# Patient Record
Sex: Female | Born: 1985 | Race: White | Hispanic: No | Marital: Married | Smoking: Former smoker
Health system: Southern US, Community
[De-identification: ages and names within clinical notes are randomized; demographics above are authoritative.]

## PROBLEM LIST (undated history)

## (undated) DIAGNOSIS — F419 Anxiety disorder, unspecified: Secondary | ICD-10-CM

## (undated) DIAGNOSIS — R569 Unspecified convulsions: Secondary | ICD-10-CM

## (undated) DIAGNOSIS — S161XXA Strain of muscle, fascia and tendon at neck level, initial encounter: Secondary | ICD-10-CM

## (undated) DIAGNOSIS — F32A Depression, unspecified: Secondary | ICD-10-CM

## (undated) DIAGNOSIS — G709 Myoneural disorder, unspecified: Secondary | ICD-10-CM

## (undated) DIAGNOSIS — M62838 Other muscle spasm: Secondary | ICD-10-CM

## (undated) DIAGNOSIS — F329 Major depressive disorder, single episode, unspecified: Secondary | ICD-10-CM

## (undated) HISTORY — DX: Myoneural disorder, unspecified: G70.9

## (undated) HISTORY — DX: Anxiety disorder, unspecified: F41.9

## (undated) HISTORY — PX: WISDOM TOOTH EXTRACTION: SHX21

## (undated) HISTORY — DX: Major depressive disorder, single episode, unspecified: F32.9

## (undated) HISTORY — DX: Depression, unspecified: F32.A

---
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
## 2003-09-19 ENCOUNTER — Other Ambulatory Visit: Admission: RE | Admit: 2003-09-19 | Discharge: 2003-09-19 | Payer: Self-pay | Admitting: Obstetrics and Gynecology

## 2004-10-02 ENCOUNTER — Other Ambulatory Visit: Admission: RE | Admit: 2004-10-02 | Discharge: 2004-10-02 | Payer: Self-pay | Admitting: Obstetrics and Gynecology

## 2005-09-11 ENCOUNTER — Other Ambulatory Visit: Admission: RE | Admit: 2005-09-11 | Discharge: 2005-09-11 | Payer: Self-pay | Admitting: Obstetrics and Gynecology

## 2011-02-01 ENCOUNTER — Other Ambulatory Visit: Payer: Self-pay | Admitting: Internal Medicine

## 2011-02-01 DIAGNOSIS — M542 Cervicalgia: Secondary | ICD-10-CM

## 2011-02-05 ENCOUNTER — Ambulatory Visit
Admission: RE | Admit: 2011-02-05 | Discharge: 2011-02-05 | Disposition: A | Discharge status: Home / Self Care | Payer: BC Managed Care – PPO | Source: Ambulatory Visit | Attending: Internal Medicine | Admitting: Internal Medicine

## 2011-02-05 DIAGNOSIS — M542 Cervicalgia: Secondary | ICD-10-CM

## 2012-08-21 ENCOUNTER — Ambulatory Visit (INDEPENDENT_AMBULATORY_CARE_PROVIDER_SITE_OTHER): Payer: BC Managed Care – PPO | Admitting: Internal Medicine

## 2012-08-21 VITALS — BP 118/80 | HR 69 | Temp 98.2°F | Resp 16 | Ht 64.5 in | Wt 142.0 lb

## 2012-08-21 DIAGNOSIS — J019 Acute sinusitis, unspecified: Secondary | ICD-10-CM

## 2012-08-21 DIAGNOSIS — F411 Generalized anxiety disorder: Secondary | ICD-10-CM | POA: Insufficient documentation

## 2012-08-21 DIAGNOSIS — F341 Dysthymic disorder: Secondary | ICD-10-CM

## 2012-08-21 DIAGNOSIS — F418 Other specified anxiety disorders: Secondary | ICD-10-CM | POA: Insufficient documentation

## 2012-08-21 DIAGNOSIS — G4700 Insomnia, unspecified: Secondary | ICD-10-CM

## 2012-08-21 MED ORDER — AZITHROMYCIN 250 MG PO TABS: ORAL_TABLET | ORAL | Status: DC

## 2012-08-21 MED ORDER — FLUOXETINE HCL 20 MG PO CAPS: 60.0000 mg | ORAL_CAPSULE | Freq: Every day | ORAL | Status: DC

## 2012-08-21 MED ORDER — CLONAZEPAM 1 MG PO TABS: 1.0000 mg | ORAL_TABLET | Freq: Two times a day (BID) | ORAL | Status: DC | PRN

## 2012-08-21 MED ORDER — ZOLPIDEM TARTRATE 5 MG PO TABS: 5.0000 mg | ORAL_TABLET | Freq: Every evening | ORAL | Status: DC | PRN

## 2012-08-21 NOTE — Progress Notes (Signed)
Subjective:    Patient ID: Stefanie Holder, female    DOB: [birth year], 27 y.o.   MRN: [id number]  HPI  Patient comes in today with complaints of fatigue, sore throat, coughing for the past week. Coughing is productive but now she feels her voice is starting go. She has tried Ibuprofen and NyQuil. She is not sure if she has had a fever. She denies any difficulty breathing. She has woken up a few times at night coughing. Long history of sinus problems and allergies.  Patient suffers from depression and needs a refill of her Prozac. She is satisfied with the results of the medication. She is unable to go to her regular doctor, Dr. Waverly Ferrari because she retired. The office will not just transfer her records, she has to establish as a new patient again. She would like to have [place] manage her medication for her. She has seen me multiple times, as does her mother.   Patient just went to a holistic doctor in [place] one year ago for joint and neck pain. He found that she does not absorb B vitamins. She was put on Methyfolate. The doctor suggested that she might be able to ween off the anxiety and depression medications. Patient has a strong family history of anxiety and depression, she does not think this is a good idea.   She takes Klonpin each night. She is still a Product/process development scientist. She worries about everything. She is worried about an upcoming trip, her neck spasming again. She is not under as much stress as she was. Denies obsessions.  Social history-nice established relationship Donicia Druck to [place] with family soon  Child: Well  Review of Systems  No weight loss Insomnia controlled by Klonopin No chest pain or palpitations Continues with lots of trouble with neck and shoulder pain/at one point required a lot of work but is now much better however is not doing daily exercises as requested Much of this is postural Her no chest pain or palpitations No neurological problems    Objective:   Physical  Exam BP 118/80  Pulse 69  Temp(Src) 98.2 F (36.8 C) (Oral)  Resp 16  Ht 5' 4.5" (1.638 m)  Wt 142 lb (64.411 kg)  BMI 24.01 kg/m2  SpO2 98%  LMP 08/05/2012 PERRLA/ EOM s conj TMs clear Nares boggy with purulent Throat clear without lymphadenopathy Chest clear to auscultation Mildly tender trapezii kyphotic posture Neck has good range of motion No neurological changes and extremities      Assessment & Plan:  Generalized anxiety disorder  Depression with anxiety Sinusitis, acute Insomnia  Recurrent neck pain  Yoga/exercises Medicines refilled Long discussion about deciding what role anxiety plays whether we need to work harder to control this  Meds ordered this encounter  Medications  . DISCONTD: FLUoxetine (PROZAC) 20 MG capsule    Sig: Take 20 mg by mouth daily. Takes 60mg  ( three 20mg  daily)  . DISCONTD: clonazePAM (KLONOPIN) 1 MG tablet    Sig: Take 1 mg by mouth 2 (two) times daily as needed for anxiety.  Marland Kitchen DISCONTD: zolpidem (AMBIEN) 5 MG tablet    Sig: Take 5 mg by mouth at bedtime as needed for sleep.  . Norgestimate-Ethinyl Estradiol Triphasic (ORTHO TRI-CYCLEN, 28,) 0.18/0.215/0.25 MG-35 MCG tablet    Sig: Take 1 tablet by mouth daily.  . Multiple Vitamins-Minerals (MULTIVITAMIN WITH MINERALS) tablet    Sig: Take 1 tablet by mouth daily.  Marland Kitchen azithromycin (ZITHROMAX) 250 MG tablet    Sig: Take  2 on day one. Then 1 each day for 4 days.    Dispense:  6 each    Refill:  0  . FLUoxetine (PROZAC) 20 MG capsule    Sig: Take 3 capsules (60 mg total) by mouth daily.    Dispense:  270 capsule    Refill:  3  . clonazePAM (KLONOPIN) 1 MG tablet    Sig: Take 1 tablet (1 mg total) by mouth 2 (two) times daily as needed for anxiety.    Dispense:  30 tablet    Refill:  3  . zolpidem (AMBIEN) 5 MG tablet    Sig: Take 1 tablet (5 mg total) by mouth at bedtime as needed for sleep.    Dispense:  30 tablet    Refill:  5

## 2012-12-20 ENCOUNTER — Ambulatory Visit (INDEPENDENT_AMBULATORY_CARE_PROVIDER_SITE_OTHER): Payer: BC Managed Care – PPO | Admitting: Family Medicine

## 2012-12-20 VITALS — BP 110/62 | HR 70 | Temp 98.6°F | Resp 16 | Ht 64.5 in | Wt 135.8 lb

## 2012-12-20 DIAGNOSIS — J019 Acute sinusitis, unspecified: Secondary | ICD-10-CM

## 2012-12-20 MED ORDER — METHYLPREDNISOLONE ACETATE 80 MG/ML IJ SUSP
80.0000 mg | Freq: Once | INTRAMUSCULAR | Status: AC
  Administered 2012-12-20: 80 mg via INTRAMUSCULAR

## 2012-12-20 MED ORDER — FLUTICASONE PROPIONATE 50 MCG/ACT NA SUSP: 2.0000 | Freq: Every day | NASAL | Status: DC

## 2012-12-20 MED ORDER — IPRATROPIUM BROMIDE 0.03 % NA SOLN: 2.0000 | Freq: Four times a day (QID) | NASAL | Status: DC

## 2012-12-20 MED ORDER — AZITHROMYCIN 250 MG PO TABS: ORAL_TABLET | ORAL | Status: DC

## 2012-12-20 MED ORDER — HYDROCODONE-HOMATROPINE 5-1.5 MG/5ML PO SYRP: 5.0000 mL | ORAL_SOLUTION | Freq: Three times a day (TID) | ORAL | Status: DC | PRN

## 2012-12-20 NOTE — Progress Notes (Signed)
Subjective:    Patient ID: Stefanie Holder, female    DOB: 1985-09-28, 27 y.o.   MRN: [id number] Chief Complaint  Patient presents with  . Cough    x 1 wk productive/green  . Sinusitis    x 1 wk   HPI  9d ago woke in the middle of the night with pharyngitis which resolved mostly but very fatigued, feeling ill, lots of sinus pain, HAs, low grade fevers, cough productive of green sputum.  Has been taking nyquil and dayquil and otc cold medicine and ibuprofen.  Not sleeping well due to sxs.  + adenopathy, no ear or jaw pain.  Has been nauseated but tol po. Nml BM and voids.    Past Medical History  Diagnosis Date  . Depression   . Anxiety   . Neuromuscular disorder     MTHFR gene controlled on methyl folate supp and vit B6 and 12   Current Outpatient Prescriptions on File Prior to Visit  Medication Sig Dispense Refill  . clonazePAM (KLONOPIN) 1 MG tablet Take 1 tablet (1 mg total) by mouth 2 (two) times daily as needed for anxiety.  30 tablet  3  . FLUoxetine (PROZAC) 20 MG capsule Take 3 capsules (60 mg total) by mouth daily.  270 capsule  3  . Multiple Vitamins-Minerals (MULTIVITAMIN WITH MINERALS) tablet Take 1 tablet by mouth daily.      . Norgestimate-Ethinyl Estradiol Triphasic (ORTHO TRI-CYCLEN, 28,) 0.18/0.215/0.25 MG-35 MCG tablet Take 1 tablet by mouth daily.      Marland Kitchen zolpidem (AMBIEN) 5 MG tablet Take 1 tablet (5 mg total) by mouth at bedtime as needed for sleep.  30 tablet  5   No current facility-administered medications on file prior to visit.   Allergies  Allergen Reactions  . Amoxapine And Related   . Penicillins      Review of Systems  Constitutional: Positive for fever, activity change and fatigue. Negative for chills, diaphoresis and appetite change.  HENT: Positive for congestion, sore throat, rhinorrhea, sneezing, postnasal drip and sinus pressure. Negative for ear pain, nosebleeds, trouble swallowing, neck pain, neck stiffness, dental problem, voice change and  ear discharge.   Eyes: Negative for discharge and itching.  Respiratory: Positive for cough. Negative for shortness of breath.   Cardiovascular: Negative for chest pain.  Gastrointestinal: Positive for nausea. Negative for vomiting and abdominal pain.  Skin: Negative for rash.  Neurological: Positive for headaches. Negative for dizziness and syncope.  Hematological: Positive for adenopathy.  Psychiatric/Behavioral: Positive for sleep disturbance.      BP 110/62  Pulse 70  Temp(Src) 98.6 F (37 C) (Oral)  Resp 16  Ht 5' 4.5" (1.638 m)  Wt 135 lb 12.8 oz (61.598 kg)  BMI 22.96 kg/m2  SpO2 100%  LMP 11/23/2012 Objective:   Physical Exam  Constitutional: She is oriented to person, place, and time. She appears well-developed and well-nourished. She appears lethargic. She appears ill. No distress.  HENT:  Head: Normocephalic and atraumatic.  Right Ear: External ear and ear canal normal. Tympanic membrane is retracted. A middle ear effusion is present.  Left Ear: External ear and ear canal normal. Tympanic membrane is retracted. A middle ear effusion is present.  Nose: Mucosal edema and rhinorrhea present. Right sinus exhibits maxillary sinus tenderness. Left sinus exhibits maxillary sinus tenderness.  Mouth/Throat: Uvula is midline and mucous membranes are normal. Posterior oropharyngeal erythema present. No oropharyngeal exudate, posterior oropharyngeal edema or tonsillar abscesses.  Eyes: Conjunctivae are normal. Right eye exhibits no  discharge. Left eye exhibits no discharge. No scleral icterus.  Neck: Normal range of motion. Neck supple.  Cardiovascular: Normal rate, regular rhythm, normal heart sounds and intact distal pulses.   Pulmonary/Chest: Effort normal and breath sounds normal.  Lymphadenopathy:       Head (right side): Submandibular adenopathy present. No preauricular and no posterior auricular adenopathy present.       Head (left side): Submandibular adenopathy present. No  preauricular and no posterior auricular adenopathy present.    She has no cervical adenopathy.       Right: No supraclavicular adenopathy present.       Left: No supraclavicular adenopathy present.  Neurological: She is oriented to person, place, and time. She appears lethargic.  Skin: Skin is warm and dry. She is not diaphoretic. No erythema.  Psychiatric: She has a normal mood and affect. Her behavior is normal.      Assessment & Plan:  Sinusitis, acute - Plan: azithromycin (ZITHROMAX) 250 MG tablet, methylPREDNISolone acetate (DEPO-MEDROL) injection 80 mg  Meds ordered this encounter  Medications  . azithromycin (ZITHROMAX) 250 MG tablet    Sig: Take 2 on day one. Then 1 each day for 4 days.    Dispense:  6 each    Refill:  0  . HYDROcodone-homatropine (HYCODAN) 5-1.5 MG/5ML syrup    Sig: Take 5 mLs by mouth every 8 (eight) hours as needed for cough.    Dispense:  120 mL    Refill:  0  . ipratropium (ATROVENT) 0.03 % nasal spray    Sig: Place 2 sprays into the nose 4 (four) times daily.    Dispense:  30 mL    Refill:  1  . fluticasone (FLONASE) 50 MCG/ACT nasal spray    Sig: Place 2 sprays into the nose daily.    Dispense:  16 g    Refill:  1  . methylPREDNISolone acetate (DEPO-MEDROL) injection 80 mg    Sig:

## 2012-12-20 NOTE — Patient Instructions (Signed)
Hot showers or breathing in steam may help loosen the congestion.  Using a netti pot or sinus rinse is also likely to help you feel better and keep this from progressing.  Use the atrovent nasal spray as needed throughout the day and use the fluticasone nasal spray every night before bed for at least 2 weeks.  I recommend augmenting with 12 hr sudafed (behind the counter) and generic mucinex to help you move out the congestion.  If no improvement or you are getting worse, come back as you might need a course of steroids but hopefully with all of the above, you can avoid it. Sinusitis Sinusitis is redness, soreness, and swelling (inflammation) of the paranasal sinuses. Paranasal sinuses are air pockets within the bones of your face (beneath the eyes, the middle of the forehead, or above the eyes). In healthy paranasal sinuses, mucus is able to drain out, and air is able to circulate through them by way of your nose. However, when your paranasal sinuses are inflamed, mucus and air can become trapped. This can allow bacteria and other germs to grow and cause infection. Sinusitis can develop quickly and last only a short time (acute) or continue over a long period (chronic). Sinusitis that lasts for more than 12 weeks is considered chronic.  CAUSES  Causes of sinusitis include:  Allergies.  Structural abnormalities, such as displacement of the cartilage that separates your nostrils (deviated septum), which can decrease the air flow through your nose and sinuses and affect sinus drainage.  Functional abnormalities, such as when the small hairs (cilia) that line your sinuses and help remove mucus do not work properly or are not present. SYMPTOMS  Symptoms of acute and chronic sinusitis are the same. The primary symptoms are pain and pressure around the affected sinuses. Other symptoms include:  Upper toothache.  Earache.  Headache.  Bad breath.  Decreased sense of smell and taste.  A cough, which  worsens when you are lying flat.  Fatigue.  Fever.  Thick drainage from your nose, which often is green and may contain pus (purulent).  Swelling and warmth over the affected sinuses. DIAGNOSIS  Your caregiver will perform a physical exam. During the exam, your caregiver may:  Look in your nose for signs of abnormal growths in your nostrils (nasal polyps).  Tap over the affected sinus to check for signs of infection.  View the inside of your sinuses (endoscopy) with a special imaging device with a light attached (endoscope), which is inserted into your sinuses. If your caregiver suspects that you have chronic sinusitis, one or more of the following tests may be recommended:  Allergy tests.  Nasal culture A sample of mucus is taken from your nose and sent to a lab and screened for bacteria.  Nasal cytology A sample of mucus is taken from your nose and examined by your caregiver to determine if your sinusitis is related to an allergy. TREATMENT  Most cases of acute sinusitis are related to a viral infection and will resolve on their own within 10 days. Sometimes medicines are prescribed to help relieve symptoms (pain medicine, decongestants, nasal steroid sprays, or saline sprays).  However, for sinusitis related to a bacterial infection, your caregiver will prescribe antibiotic medicines. These are medicines that will help kill the bacteria causing the infection.  Rarely, sinusitis is caused by a fungal infection. In theses cases, your caregiver will prescribe antifungal medicine. For some cases of chronic sinusitis, surgery is needed. Generally, these are cases in   which sinusitis recurs more than 3 times per year, despite other treatments. HOME CARE INSTRUCTIONS   Drink plenty of water. Water helps thin the mucus so your sinuses can drain more easily.  Use a humidifier.  Inhale steam 3 to 4 times a day (for example, sit in the bathroom with the shower running).  Apply a warm,  moist washcloth to your face 3 to 4 times a day, or as directed by your caregiver.  Use saline nasal sprays to help moisten and clean your sinuses.  Take over-the-counter or prescription medicines for pain, discomfort, or fever only as directed by your caregiver. SEEK IMMEDIATE MEDICAL CARE IF:  You have increasing pain or severe headaches.  You have nausea, vomiting, or drowsiness.  You have swelling around your face.  You have vision problems.  You have a stiff neck.  You have difficulty breathing. MAKE SURE YOU:   Understand these instructions.  Will watch your condition.  Will get help right away if you are not doing well or get worse. Document Released: 05/27/2005 Document Revised: 08/19/2011 Document Reviewed: 06/11/2011 Exit[employer] Patient Information 2014 [employer].  

## 2013-04-15 ENCOUNTER — Telehealth: Payer: Self-pay

## 2013-04-15 DIAGNOSIS — G4700 Insomnia, unspecified: Secondary | ICD-10-CM

## 2013-04-15 DIAGNOSIS — F411 Generalized anxiety disorder: Secondary | ICD-10-CM

## 2013-04-15 NOTE — Telephone Encounter (Signed)
Pharm also sent req for clonazepam 1 mg. I will pend 1 mos of this also for review.

## 2013-04-15 NOTE — Telephone Encounter (Signed)
Pharm requests RF of zolpidem 5 mg. Dr Merla Riches,  I have pended 1 mos RF for your review, but you may want to see pt first?

## 2013-04-16 MED ORDER — CLONAZEPAM 1 MG PO TABS: 1.0000 mg | ORAL_TABLET | Freq: Two times a day (BID) | ORAL | Status: DC | PRN

## 2013-04-16 MED ORDER — ZOLPIDEM TARTRATE 5 MG PO TABS: 5.0000 mg | ORAL_TABLET | Freq: Every evening | ORAL | Status: DC | PRN

## 2013-06-23 ENCOUNTER — Other Ambulatory Visit: Payer: Self-pay

## 2013-06-23 DIAGNOSIS — F411 Generalized anxiety disorder: Secondary | ICD-10-CM

## 2013-06-23 DIAGNOSIS — G4700 Insomnia, unspecified: Secondary | ICD-10-CM

## 2013-06-23 NOTE — Telephone Encounter (Signed)
Pharm sent RF reqs for zolpidem and clonazepam. Last RF we put message on RF that pt needs OV. Dr Merla Richesoolittle sent a comment on approval that he wanted pt to be scheduled for appt and it looks like that comment was not seen and no appt set up. I spoke w/Kim who stated that if pt can come for appt on Feb 25th at 2:30, she can fit her in. Otherwise pt should come to walk-in. LMOM for pt to CB and we need to let Selena BattenKim know if pt can NOT come to appt. I will pend the RFs and wait until pt calls back w/plan.

## 2013-06-23 NOTE — Telephone Encounter (Signed)
Pt called and did not realize she was overdue for f/up. She agreed to come to appt Feb 25th. Can we get her RFs?

## 2013-06-24 MED ORDER — ZOLPIDEM TARTRATE 5 MG PO TABS: 5.0000 mg | ORAL_TABLET | Freq: Every evening | ORAL | Status: DC | PRN

## 2013-06-24 MED ORDER — CLONAZEPAM 1 MG PO TABS: 1.0000 mg | ORAL_TABLET | Freq: Two times a day (BID) | ORAL | Status: DC | PRN

## 2013-06-24 NOTE — Telephone Encounter (Signed)
Ready - pleasr read prior notes about recheck/appt

## 2013-08-04 ENCOUNTER — Encounter: Payer: Self-pay | Admitting: Internal Medicine

## 2013-08-04 ENCOUNTER — Ambulatory Visit (INDEPENDENT_AMBULATORY_CARE_PROVIDER_SITE_OTHER): Payer: BC Managed Care – PPO | Admitting: Internal Medicine

## 2013-08-04 VITALS — BP 100/56 | HR 64 | Temp 98.4°F | Resp 16 | Ht 64.0 in | Wt 136.8 lb

## 2013-08-04 DIAGNOSIS — F418 Other specified anxiety disorders: Secondary | ICD-10-CM

## 2013-08-04 DIAGNOSIS — R6882 Decreased libido: Secondary | ICD-10-CM

## 2013-08-04 DIAGNOSIS — F411 Generalized anxiety disorder: Secondary | ICD-10-CM

## 2013-08-04 DIAGNOSIS — G4700 Insomnia, unspecified: Secondary | ICD-10-CM

## 2013-08-04 DIAGNOSIS — F341 Dysthymic disorder: Secondary | ICD-10-CM

## 2013-08-04 MED ORDER — CLONAZEPAM 1 MG PO TABS: 1.0000 mg | ORAL_TABLET | Freq: Two times a day (BID) | ORAL | Status: DC | PRN

## 2013-08-04 MED ORDER — ZOLPIDEM TARTRATE 5 MG PO TABS: 5.0000 mg | ORAL_TABLET | Freq: Every evening | ORAL | Status: DC | PRN

## 2013-08-04 MED ORDER — BUPROPION HCL ER (XL) 150 MG PO TB24: 150.0000 mg | ORAL_TABLET | Freq: Every day | ORAL | Status: DC

## 2013-08-04 NOTE — Patient Instructions (Signed)
[employer] pleasure [employer]-by [employer] and [employer]  Mindfulness--Daniel Siegal

## 2013-08-04 NOTE — Progress Notes (Addendum)
Subjective:    Patient ID: Stefanie Holder, female    DOB: Nov 05, 1985, 28 y.o.   MRN: [id number] This chart was scribed for Tonye Pearsonobert P Lakishia Bourassa, MD by Danella Maiersaroline Early, ED Scribe. This patient was seen in room 25 and the patient's care was started at 3:27 PM.  Chief Complaint  Patient presents with  . Medication Refill    HPI HPI Comments: Stefanie Holder is a 28 y.o. female with a h/o anxiety, depression, and insomnia who presents to the [facility] for a medication refill. She recently had labs drawn for work, all normal. She takes methyl-folate supplements. She is still taking Ambien, although she reduced it to half a pill, with good sleep.   She reports a low libido for the last year that she thinks is due to Prozac, although she has been on Prozac for 2-3 years. She [place]butrin in [employer]. She stopped taking Wellbutrin because she was having headaches but states she was also taking Adderall at that time and the headaches could have been due to Adderall. Has a mnd that doesn't shut off. Lots of stress/being stressed by work,family,wedding planning. Strong family hx Depr/anx-Mom.  She is in the midst of planning her wedding. [place] are traveling around [place] for [place].   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 Generalized anxiety disorder 08/21/2012  . Depression with anxiety 08/21/2012  . Insomnia 08/21/2012   Current Outpatient Prescriptions on File Prior to Visit  Medication Sig Dispense Refill  . clonazePAM (KLONOPIN) 1 MG tablet Take 1 tablet (1 mg total) by mouth 2 (two) times daily as needed for anxiety. PATIENT NEEDS OFFICE VISIT FOR ADDITIONAL REFILLS  30 tablet  0  . FLUoxetine (PROZAC) 20 MG capsule Take 3 capsules (60 mg total) by mouth daily.  270 capsule  3  . Multiple Vitamins-Minerals (MULTIVITAMIN WITH MINERALS) tablet Take  1 tablet by mouth daily.      . Norgestimate-Ethinyl Estradiol Triphasic (ORTHO TRI-CYCLEN, 28,) 0.18/0.215/0.25 MG-35 MCG tablet Take 1 tablet by mouth daily.      Marland Kitchen. zolpidem (AMBIEN) 5 MG tablet Take 1 tablet (5 mg total) by mouth at bedtime as needed for sleep. PATIENT NEEDS OFFICE VISIT FOR ADDITIONAL REFILLS  30 tablet  0  . azithromycin (ZITHROMAX) 250 MG tablet Take 2 on day one. Then 1 each day for 4 days.  6 each  0  . fluticasone (FLONASE) 50 MCG/ACT nasal spray Place 2 sprays into the nose daily.  16 g  1  . HYDROcodone-homatropine (HYCODAN) 5-1.5 MG/5ML syrup Take 5 mLs by mouth every 8 (eight) hours as needed for cough.  120 mL  0  . ipratropium (ATROVENT) 0.03 % nasal spray Place 2 sprays into the nose 4 (four) times daily.  30 mL  1   No current facility-administered medications on file prior to visit.      Review of Systems  Constitutional: Negative for fever, activity change, appetite change and unexpected weight change.  HENT: Negative for trouble swallowing.   Eyes: Negative for visual disturbance.  Respiratory: Negative for shortness of breath.   Cardiovascular: Negative for chest pain, palpitations and leg swelling.  Gastrointestinal: Negative for abdominal pain, diarrhea and constipation.  Genitourinary: Negative for difficulty urinating.  Neurological: Negative for light-headedness and headaches.  Psychiatric/Behavioral:  Was on ADD meds in hs       Objective:   Physical Exam  Nursing note and vitals reviewed. Constitutional: She is oriented to person, place, and time. She appears well-developed and well-nourished. No distress.  HENT:  Head: Normocephalic and atraumatic.  Eyes: EOM are normal.  Neck: Neck supple.  Cardiovascular: Normal rate.   Pulmonary/Chest: Effort normal. No respiratory distress.  Musculoskeletal: Normal range of motion.  Neurological: She is alert and oriented to person, place, and time.  Skin: Skin is warm and dry.    Psychiatric: She has a normal mood and affect. Her behavior is normal.    Filed Vitals:   08/04/13 1442  BP: 100/56  Pulse: 64  Temp: 98.4 F (36.9 C)  TempSrc: Oral  Resp: 16  Height: 5\' 4"  (1.626 m)  Weight: 136 lb 12.8 oz (62.052 kg)  SpO2: 97%    Wt Readings from Last 3 Encounters:  08/04/13 136 lb 12.8 oz (62.052 kg)  12/20/12 135 lb 12.8 oz (61.598 kg)  08/21/12 142 lb (64.411 kg)        Assessment & Plan:  Generalized anxiety disorder - Plan: clonazePAM (KLONOPIN) 1 MG tablet,   Cont proz but decr to 40 and add wellbutr 15xl--f/u 1 mo//  ?needs rx ADD in future  Depression with anxiety  Insomnia - Plan: zolpidem (AMBIEN) 5 MG tablet  Decreased libido-ref to pleasure bond//is working out more ellip at [employer]  Elevated copper--of unsure significance//is taking zinc supplements  I have completed the patient encounter in its entirety as documented by the scribe, with editing by me where necessary. Valree Feild P. Merla Riches, M.D.

## 2013-10-21 ENCOUNTER — Other Ambulatory Visit: Payer: Self-pay | Admitting: Internal Medicine

## 2014-01-03 ENCOUNTER — Telehealth: Payer: Self-pay

## 2014-01-03 DIAGNOSIS — F418 Other specified anxiety disorders: Secondary | ICD-10-CM

## 2014-01-03 NOTE — Telephone Encounter (Signed)
Dr.Doolittle, Pt would like to know if she could have enough fluoextine to last her until 01/17/14, when she is able to come into the office for an OV. [phone]#[phone]

## 2014-01-04 MED ORDER — FLUOXETINE HCL 20 MG PO CAPS: 60.0000 mg | ORAL_CAPSULE | Freq: Every day | ORAL | Status: DC

## 2014-01-04 NOTE — Telephone Encounter (Signed)
Meds ordered this encounter  Medications  . FLUoxetine (PROZAC) 20 MG capsule    Sig: Take 3 capsules (60 mg total) by mouth daily.    Dispense:  270 capsule    Refill:

## 2014-01-05 NOTE — Telephone Encounter (Signed)
Spoke to pt, she is aware 

## 2014-03-22 ENCOUNTER — Other Ambulatory Visit: Payer: Self-pay | Admitting: Internal Medicine

## 2014-03-23 NOTE — Telephone Encounter (Signed)
Faxed

## 2014-06-04 ENCOUNTER — Other Ambulatory Visit: Payer: Self-pay | Admitting: Internal Medicine

## 2014-06-07 ENCOUNTER — Other Ambulatory Visit: Payer: Self-pay

## 2014-06-07 DIAGNOSIS — F411 Generalized anxiety disorder: Secondary | ICD-10-CM

## 2014-06-07 MED ORDER — CLONAZEPAM 1 MG PO TABS: 1.0000 mg | ORAL_TABLET | Freq: Two times a day (BID) | ORAL | Status: DC | PRN

## 2014-06-07 NOTE — Telephone Encounter (Signed)
Faxed

## 2014-06-07 NOTE — Telephone Encounter (Signed)
Called in Rx

## 2014-06-07 NOTE — Telephone Encounter (Signed)
Pharm reqs RF of clonazepam. Dr Merla Richesoolittle you last saw pt in 07/2013. Do you want to RF or pt RTC first?

## 2014-06-22 ENCOUNTER — Other Ambulatory Visit: Payer: Self-pay

## 2014-06-22 DIAGNOSIS — F411 Generalized anxiety disorder: Secondary | ICD-10-CM

## 2014-06-22 MED ORDER — CLONAZEPAM 1 MG PO TABS: 1.0000 mg | ORAL_TABLET | Freq: Two times a day (BID) | ORAL | Status: DC | PRN

## 2014-06-22 NOTE — Telephone Encounter (Signed)
Called pt to let her know she needs to f/u with Dr. Merla Richesoolittle. She will be in next Fri or Sat. Rx called in

## 2014-06-22 NOTE — Telephone Encounter (Signed)
Pharm reqs RF of clonazepam. Dr Merla Richesoolittle, last filled on 06/07/14 #30 w/note that pt needs OV for more RFs. Do you want to deny this or reduce amount w/another warning? Pended w/fewer tablets and another notice.

## 2014-08-17 ENCOUNTER — Other Ambulatory Visit: Payer: Self-pay

## 2014-08-17 MED ORDER — ZOLPIDEM TARTRATE 5 MG PO TABS: 5.0000 mg | ORAL_TABLET | Freq: Every evening | ORAL | Status: DC | PRN

## 2014-08-17 NOTE — Telephone Encounter (Signed)
Pharm reqs RF of zolpidem. It looks like we have given pt mult warnings that she needs OV, last of which was 06/22/14 on phone and she had agreed. Pt has still not come in or set appt. Do you want to deny?

## 2014-08-18 NOTE — Telephone Encounter (Signed)
Faxed

## 2014-08-29 ENCOUNTER — Ambulatory Visit (INDEPENDENT_AMBULATORY_CARE_PROVIDER_SITE_OTHER): Payer: BLUE CROSS/BLUE SHIELD | Admitting: Internal Medicine

## 2014-08-29 VITALS — BP 112/68 | HR 60 | Temp 98.2°F | Resp 16 | Ht 65.0 in | Wt 139.2 lb

## 2014-08-29 DIAGNOSIS — F411 Generalized anxiety disorder: Secondary | ICD-10-CM

## 2014-08-29 DIAGNOSIS — M542 Cervicalgia: Secondary | ICD-10-CM | POA: Diagnosis not present

## 2014-08-29 DIAGNOSIS — G4700 Insomnia, unspecified: Secondary | ICD-10-CM | POA: Diagnosis not present

## 2014-08-29 DIAGNOSIS — F418 Other specified anxiety disorders: Secondary | ICD-10-CM

## 2014-08-29 DIAGNOSIS — J340 Abscess, furuncle and carbuncle of nose: Secondary | ICD-10-CM | POA: Diagnosis not present

## 2014-08-29 MED ORDER — ZOLPIDEM TARTRATE 5 MG PO TABS: 5.0000 mg | ORAL_TABLET | Freq: Every evening | ORAL | Status: DC | PRN

## 2014-08-29 MED ORDER — FLUOXETINE HCL 20 MG PO CAPS: 60.0000 mg | ORAL_CAPSULE | Freq: Every day | ORAL | Status: DC

## 2014-08-29 MED ORDER — CLONAZEPAM 1 MG PO TABS
1.0000 mg | ORAL_TABLET | Freq: Two times a day (BID) | ORAL | Status: DC | PRN
Start: 2014-08-29 — End: 2015-04-12

## 2014-08-29 NOTE — Progress Notes (Addendum)
Subjective:    Patient ID: Stefanie LukesLauren Holder, female    DOB: 1985-11-27, 29 y.o.   MRN: [id number] This chart was scribed for Ellamae Siaobert Abigale Dorow, MD by Jolene Provostobert Halas, Medical Scribe. This patient was seen in Room 10 and the patient's care was started a 6:30 PM.  Chief Complaint  Patient presents with  . Follow-up    medication     HPI HPI Comments: Stefanie LukesLauren Holder is a 29 y.o. female with a hx of GAD, depression, anxiety and insomnia who [facility] follow up appointment. Pt states she takes Ambien and klonopin at night. Occasionally needs daytime Klonopin for acute anxiety. She is doing well in general. She is very happy since her marriage last year. Most of her stress is generated by her work situation. She likes the people but does not like the type of work she is currently doing although she is very proficient. No fatigue. I anxiety well controlled. 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  Pt also states she is again having neck pain again with associated HA. Pt states she has been to the chiropractor which gives her relief for 3-4 days but her sx recur after that. Past history of same responded to physical therapy and medicines a few years ago. There are no radicular symptoms and no weakness in the hands. She is now having daily symptoms particularly on the left. Pt also states that her nose ring may be infected. She has a piercing and has had redness around it with some discharge over the past month. She is trying consistent cleaning without success so far. It is tender.  PMH-MTHFR Gene with elev homocyst--on MVI  Review of Systems  Constitutional: Negative for fever, activity change, appetite change, fatigue and unexpected weight change.  HENT: Negative for dental problem and trouble swallowing.   Eyes: Negative for visual disturbance.  Respiratory: Negative for shortness of breath.   Cardiovascular: Negative for chest pain and palpitations.  Genitourinary: Negative for  difficulty urinating and menstrual problem.  Neurological: Negative for dizziness.  Psychiatric/Behavioral:       Past history of attention deficit disorder on medicines at high school       Objective:   Physical Exam  Constitutional: She is oriented to person, place, and time. She appears well-developed and well-nourished. No distress.  HENT:  Head: Normocephalic and atraumatic.  Eyes: Pupils are equal, round, and reactive to light.  Neck: Neck supple.  Pain in the right paracervical muscles, pain with flexion and extension. No sensory or motor losses.  Cardiovascular: Normal rate.   Pulmonary/Chest: Effort normal. No respiratory distress.  Musculoskeletal: Normal range of motion.  Neurological: She is alert and oriented to person, place, and time. Coordination normal.  Skin: Skin is warm and dry. She is not diaphoretic.  Nose has area of erythema surrounding piercing, skin is thickened but no active puss.   Psychiatric: She has a normal mood and affect. Her behavior is normal.  Nursing note and vitals reviewed. BP 112/68 mmHg  Pulse 60  Temp(Src) 98.2 F (36.8 C) (Oral)  Resp 16  Ht 5\' 5"  (1.651 m)  Wt 139 lb 3.2 oz (63.141 kg)  BMI 23.16 kg/m2  SpO2 100%  LMP 07/31/2014      Assessment & Plan:  Depression with anxiety - Plan: FLUoxetine (PROZAC) 20 MG capsulex3 daily  Generalized anxiety disorder - Plan: clonazePAM (KLONOPIN) 1 MG tablet--prn  Cellulitis of nose, external--cleaning/doxycycline (she is not in favor of removal  at this time)  Neck pain on right side--recurrent--- refer to Dr. Ellamae Sia  Insomnia--as above/plus Ambien  Meds ordered this encounter  Medications  . zolpidem (AMBIEN) 5 MG tablet    Sig: Take 1 tablet (5 mg total) by mouth at bedtime as needed. for sleep    Dispense:  30 tablet    Refill:  5  . FLUoxetine (PROZAC) 20 MG capsule    Sig: Take 3 capsules (60 mg total) by mouth daily.    Dispense:  270 capsule    Refill:  3  .  clonazePAM (KLONOPIN) 1 MG tablet    Sig: Take 1 tablet (1 mg total) by mouth 2 (two) times daily as needed for anxiety.    Dispense:  30 tablet    Refill:  5  . doxycycline (VIBRA-TABS) 100 MG tablet    Sig: Take 1 tablet (100 mg total) by mouth 2 (two) times daily.    Dispense:  20 tablet    Refill:  0   Follow-up 6-12 months depending on symptoms  I have completed the patient encounter in its entirety as documented by the scribe, with editing by me where necessary. Brandn Mcgath P. Merla Riches, M.D.

## 2014-08-30 ENCOUNTER — Telehealth: Payer: Self-pay

## 2014-08-30 MED ORDER — DOXYCYCLINE HYCLATE 100 MG PO TABS: 100.0000 mg | ORAL_TABLET | Freq: Two times a day (BID) | ORAL | Status: DC

## 2014-08-30 NOTE — Telephone Encounter (Signed)
Dr Theotis Barriodoolitle- Were you going to call in an abx?

## 2014-08-30 NOTE — Telephone Encounter (Signed)
Notified pt. 

## 2014-08-30 NOTE — Telephone Encounter (Signed)
I forgot to sign this in the chart so it didn't go over last night but it has been sent in now

## 2014-08-30 NOTE — Telephone Encounter (Signed)
Pt states dr Merla Richesdoolittle told her he was calling in antibiotic for piercing infection,but pharmacy has no record,    Best phone for pt is [phone]

## 2014-10-04 ENCOUNTER — Ambulatory Visit (INDEPENDENT_AMBULATORY_CARE_PROVIDER_SITE_OTHER): Payer: BLUE CROSS/BLUE SHIELD | Admitting: Internal Medicine

## 2014-10-04 VITALS — BP 112/68 | HR 60 | Temp 98.3°F | Resp 15 | Ht 64.0 in | Wt 138.6 lb

## 2014-10-04 DIAGNOSIS — R51 Headache: Secondary | ICD-10-CM | POA: Diagnosis not present

## 2014-10-04 DIAGNOSIS — S161XXA Strain of muscle, fascia and tendon at neck level, initial encounter: Secondary | ICD-10-CM | POA: Diagnosis not present

## 2014-10-04 DIAGNOSIS — R519 Headache, unspecified: Secondary | ICD-10-CM

## 2014-10-04 MED ORDER — MELOXICAM 15 MG PO TABS: 15.0000 mg | ORAL_TABLET | Freq: Every day | ORAL | Status: DC

## 2014-10-04 MED ORDER — METHOCARBAMOL 500 MG PO TABS: 500.0000 mg | ORAL_TABLET | Freq: Four times a day (QID) | ORAL | Status: DC | PRN
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
## 2014-12-27 ENCOUNTER — Other Ambulatory Visit (INDEPENDENT_AMBULATORY_CARE_PROVIDER_SITE_OTHER): Payer: BLUE CROSS/BLUE SHIELD

## 2014-12-27 ENCOUNTER — Telehealth: Payer: Self-pay

## 2014-12-27 ENCOUNTER — Other Ambulatory Visit: Payer: Self-pay | Admitting: Internal Medicine

## 2014-12-27 ENCOUNTER — Encounter: Payer: Self-pay | Admitting: Internal Medicine

## 2014-12-27 DIAGNOSIS — R350 Frequency of micturition: Secondary | ICD-10-CM | POA: Diagnosis not present

## 2014-12-27 LAB — POCT URINALYSIS DIPSTICK
Bilirubin, UA: NEGATIVE
Glucose, UA: NEGATIVE
Ketones, UA: NEGATIVE
NITRITE UA: NEGATIVE
PH UA: 6
Spec Grav, UA: 1.01
UROBILINOGEN UA: 0.2

## 2014-12-27 LAB — POCT UA - MICROSCOPIC ONLY
Casts, Ur, LPF, POC: NEGATIVE
Crystals, Ur, HPF, POC: NEGATIVE
Mucus, UA: NEGATIVE
Yeast, UA: NEGATIVE

## 2014-12-27 NOTE — Telephone Encounter (Signed)
Called pt, unable to leave message on VM. 

## 2014-12-27 NOTE — Telephone Encounter (Signed)
Arrange for her to drop a urine off for UA with deep and micro-that we might culture if positive then I'll call in an antibiotic for her before her trip this afternoon

## 2014-12-27 NOTE — Telephone Encounter (Signed)
Pt states she have a bladder infection and would like Dr.Doolittle to call her in an antibiotic. Please call [phone]    [facility] [address]

## 2014-12-27 NOTE — Telephone Encounter (Signed)
RTC? Called pt to get the details. She is leaving tomorrow and she has to work but she has urinary retention, urinary frequency, and cloudy urine. I advised her to come in for an office visit. She wants to know if she can drop off a sample to check her urine so we can Rx ABX. I told her I would ask. Please advise.

## 2014-12-27 NOTE — Telephone Encounter (Signed)
Spoke with pt, gave message from Dr. Merla Richesoolittle. Pt will come by and drop off urine. Orders placed in Epic.

## 2014-12-29 ENCOUNTER — Telehealth: Payer: Self-pay

## 2014-12-29 NOTE — Telephone Encounter (Signed)
Dr. Merla Riches, pt is calling about Urine results. Pt wants to know if she needs an abx. Please review. Thanks  [facility] [place]

## 2014-12-29 NOTE — Telephone Encounter (Signed)
Called pt, mailbox full

## 2014-12-29 NOTE — Telephone Encounter (Signed)
There are some white cells and red cells in the urine and coupled with her symptoms perhaps it's best to treat her and then follow-up after treatment if she's not well when she has time to come in You can tell her I sent a prescription for Macrobid to her pharmacy  be sure she's not pregnant//I'll be in the clinic tomorrow if she needs follow-up also Sunday

## 2015-01-01 NOTE — Telephone Encounter (Signed)
Patient called for lab results and also and antibiotic was supposed to be called in. She doesn't want this to turn into a kidney infection. Cb# [phone]. It doesn't look like labs have been reviewed yet.

## 2015-01-02 MED ORDER — NITROFURANTOIN MONOHYD MACRO 100 MG PO CAPS: 100.0000 mg | ORAL_CAPSULE | Freq: Two times a day (BID) | ORAL | Status: DC

## 2015-01-02 NOTE — Telephone Encounter (Signed)
Sent to pharmacy 

## 2015-01-02 NOTE — Telephone Encounter (Signed)
I do not see an Rx for an ABX. Can someone write for this?

## 2015-01-03 NOTE — Telephone Encounter (Signed)
Spoke to pt. Pt aware rx has been sent in.

## 2015-03-24 ENCOUNTER — Other Ambulatory Visit: Payer: Self-pay

## 2015-03-24 MED ORDER — ZOLPIDEM TARTRATE 5 MG PO TABS: 5.0000 mg | ORAL_TABLET | Freq: Every evening | ORAL | Status: DC | PRN

## 2015-03-24 NOTE — Telephone Encounter (Signed)
Pharm reqs Rf of zolpidem. Pended.

## 2015-03-24 NOTE — Telephone Encounter (Signed)
rx printed.  Meds ordered this encounter  Medications  . zolpidem (AMBIEN) 5 MG tablet    Sig: Take 1 tablet (5 mg total) by mouth at bedtime as needed. for sleep    Dispense:  30 tablet    Refill:  0   \

## 2015-03-27 NOTE — Telephone Encounter (Signed)
Faxed

## 2015-04-12 ENCOUNTER — Other Ambulatory Visit: Payer: Self-pay

## 2015-04-12 DIAGNOSIS — F411 Generalized anxiety disorder: Secondary | ICD-10-CM

## 2015-04-12 MED ORDER — CLONAZEPAM 1 MG PO TABS: 1.0000 mg | ORAL_TABLET | Freq: Two times a day (BID) | ORAL | Status: DC | PRN

## 2015-04-12 NOTE — Telephone Encounter (Signed)
Pharm reqs RF of clonazepam. Pended. 

## 2015-04-13 NOTE — Telephone Encounter (Signed)
Faxed

## 2015-05-29 ENCOUNTER — Other Ambulatory Visit: Payer: Self-pay

## 2015-05-29 MED ORDER — ZOLPIDEM TARTRATE 5 MG PO TABS: 5.0000 mg | ORAL_TABLET | Freq: Every evening | ORAL | Status: DC | PRN

## 2015-05-29 NOTE — Telephone Encounter (Signed)
Pharm reqs RF of zolpidem. Pended. 

## 2015-05-30 NOTE — Telephone Encounter (Signed)
Rx faxed

## 2015-07-11 ENCOUNTER — Ambulatory Visit (INDEPENDENT_AMBULATORY_CARE_PROVIDER_SITE_OTHER): Payer: BLUE CROSS/BLUE SHIELD | Admitting: Internal Medicine

## 2015-07-11 VITALS — BP 120/80 | HR 71 | Temp 98.4°F | Resp 20 | Ht 65.35 in | Wt 152.6 lb

## 2015-07-11 DIAGNOSIS — R635 Abnormal weight gain: Secondary | ICD-10-CM | POA: Diagnosis not present

## 2015-07-11 DIAGNOSIS — F418 Other specified anxiety disorders: Secondary | ICD-10-CM | POA: Diagnosis not present

## 2015-07-11 DIAGNOSIS — R5382 Chronic fatigue, unspecified: Secondary | ICD-10-CM | POA: Diagnosis not present

## 2015-07-11 DIAGNOSIS — F411 Generalized anxiety disorder: Secondary | ICD-10-CM | POA: Diagnosis not present

## 2015-07-11 LAB — CBC WITH DIFFERENTIAL/PLATELET
BASOS PCT: 0 % (ref 0–1)
Basophils Absolute: 0 10*3/uL (ref 0.0–0.1)
EOS ABS: 0.1 10*3/uL (ref 0.0–0.7)
EOS PCT: 1 % (ref 0–5)
HCT: 40.2 % (ref 36.0–46.0)
Hemoglobin: 13.8 g/dL (ref 12.0–15.0)
Lymphocytes Relative: 37 % (ref 12–46)
Lymphs Abs: 2.8 10*3/uL (ref 0.7–4.0)
MCH: 31.7 pg (ref 26.0–34.0)
MCHC: 34.3 g/dL (ref 30.0–36.0)
MCV: 92.2 fL (ref 78.0–100.0)
MONO ABS: 0.7 10*3/uL (ref 0.1–1.0)
MONOS PCT: 9 % (ref 3–12)
MPV: 10.3 fL (ref 8.6–12.4)
NEUTROS ABS: 4.1 10*3/uL (ref 1.7–7.7)
Neutrophils Relative %: 53 % (ref 43–77)
PLATELETS: 304 10*3/uL (ref 150–400)
RBC: 4.36 MIL/uL (ref 3.87–5.11)
RDW: 12.6 % (ref 11.5–15.5)
WBC: 7.7 10*3/uL (ref 4.0–10.5)

## 2015-07-11 LAB — COMPREHENSIVE METABOLIC PANEL
ALBUMIN: 4.1 g/dL (ref 3.6–5.1)
ALT: 11 U/L (ref 6–29)
AST: 16 U/L (ref 10–30)
Alkaline Phosphatase: 48 U/L (ref 33–115)
BILIRUBIN TOTAL: 0.4 mg/dL (ref 0.2–1.2)
BUN: 11 mg/dL (ref 7–25)
CHLORIDE: 98 mmol/L (ref 98–110)
CO2: 27 mmol/L (ref 20–31)
CREATININE: 0.81 mg/dL (ref 0.50–1.10)
Calcium: 9.8 mg/dL (ref 8.6–10.2)
GLUCOSE: 77 mg/dL (ref 65–99)
Potassium: 4.5 mmol/L (ref 3.5–5.3)
SODIUM: 134 mmol/L — AB (ref 135–146)
Total Protein: 7.5 g/dL (ref 6.1–8.1)

## 2015-07-11 LAB — POCT SEDIMENTATION RATE: POCT SED RATE: 14 mm/h (ref 0–22)

## 2015-07-11 LAB — TSH: TSH: 1.12 u[IU]/mL (ref 0.350–4.500)

## 2015-07-11 LAB — T4, FREE: FREE T4: 1.12 ng/dL (ref 0.80–1.80)

## 2015-07-11 MED ORDER — CLONAZEPAM 1 MG PO TABS: 1.0000 mg | ORAL_TABLET | Freq: Two times a day (BID) | ORAL | Status: DC | PRN

## 2015-07-11 MED ORDER — FLUOXETINE HCL 20 MG PO CAPS: 60.0000 mg | ORAL_CAPSULE | Freq: Every day | ORAL | Status: AC

## 2015-07-11 MED ORDER — ZOLPIDEM TARTRATE 5 MG PO TABS: 5.0000 mg | ORAL_TABLET | Freq: Every evening | ORAL | Status: DC | PRN

## 2015-07-11 NOTE — Progress Notes (Signed)
Subjective:    Patient ID: Stefanie Holder, female    DOB: [birth year], 30 y.o.   MRN: [id number]  HPIc/o episode at work with feeling dizzy like might pass out--room spinning a bit. Had to stop and sit with Mom in office before coming here. Not so bad now but still feels a little woozy. Was due for PT for neck but too unsteady to go. No nausea or vomiting. No blurred vision. No chest pain or palpitations. No headache. 2 days ago she had 2 or 3 episodes of diarrhea since Sunday night and felt very tired all yesterday and today. No further diarrhea.  She has felt increased fatigue for the past week or 10 days but fatigue is a consistent problem for her over the last several years and has an unclear etiology  she has been followed by [facility] and is on treatment with naturthyroid Also reportedly has a neuromuscular disorder controlled by methyl folate supplements, vitamin B12 6 (MTHFR gene) (at last OV Lyme Test suggested even that she has no history suggest Lyme disease and she decided not to because of the cost)  Has gained 10-12 pounds over the last several months for unsure reasons No infections of respiratory or urinary tract On birth control pills Married etoh fri-lots  Review of Systems  Constitutional: Positive for fatigue and unexpected weight change. Negative for fever and chills.  HENT: Negative for trouble swallowing and voice change.   Eyes: Negative for photophobia.       She describes some trouble focusing with the left eye times but nothing consistent  Respiratory: Negative for chest tightness, shortness of breath and wheezing.   Cardiovascular: Negative for chest pain, palpitations and leg swelling.  Gastrointestinal: Negative for abdominal pain.  Genitourinary: Negative for dysuria, vaginal bleeding, difficulty urinating and menstrual problem.  Musculoskeletal: Negative for back pain.       See long-term history of neck problems which is improving with  physical therapy finally  Neurological: Negative for headaches.  Psychiatric/Behavioral:       She continues to have interrupted sleep caring for her dog and complains of nonrestorative sleep with daytime fatigue. There is no snoring or observed apneas.  She also has a history of anxiety and depression and has improved with 60 mg of Prozac. She needs occasional Klonopin for her anxiety particularly late in the day to get ready for bed and occasionally needs Ambien to sleep this is not an everyday thing       Objective:   Physical Exam BP 120/80 mmHg  Pulse 71  Temp(Src) 98.4 F (36.9 C) (Oral)  Resp 20  Ht 5' 5.35" (1.66 m)  Wt 152 lb 9.6 oz (69.219 kg)  BMI 25.12 kg/m2  SpO2 96%  LMP 07/09/2015 PERRLA with EOMs conjugate TMs clear Nares clear/throat clear No nodes or thyromegaly Chest clear to auscultation Heart regular without murmur Abdomen soft nontender nondistended with no organomegaly or masses Extremities without edema Good peripheral pulses Skin without rashes Cranial nerves II through XII intact Romberg negative No sensory or motor losses Gait normal Mood stable and affect appropriate concern Thought Content normal    Wt Readings from Last 3 Encounters:  07/11/15 152 lb 9.6 oz (69.219 kg)  10/04/14 138 lb 9.6 oz (62.869 kg)  08/29/14 139 lb 3.2 oz (63.141 kg)    Assessment & Plan:  Depression with anxiety - Plan: FLUoxetine (PROZAC) 20 MG capsule  Generalized anxiety disorder - Plan: clonazePAM (KLONOPIN) 1 MG  tablet  Chronic fatigue - Plan: POCT SEDIMENTATION RATE, CBC with Differential/Platelet, Comprehensive metabolic panel, TSH, T4, free  Weight gain - Plan: TSH, T4, free  Notify labs Expander exercise for home neck work

## 2015-09-11 DIAGNOSIS — M62838 Other muscle spasm: Secondary | ICD-10-CM | POA: Diagnosis not present

## 2015-09-11 DIAGNOSIS — M5481 Occipital neuralgia: Secondary | ICD-10-CM | POA: Diagnosis not present

## 2015-09-11 DIAGNOSIS — S138XXD Sprain of joints and ligaments of other parts of neck, subsequent encounter: Secondary | ICD-10-CM | POA: Diagnosis not present

## 2015-10-02 DIAGNOSIS — M62838 Other muscle spasm: Secondary | ICD-10-CM | POA: Diagnosis not present

## 2015-10-02 DIAGNOSIS — M5481 Occipital neuralgia: Secondary | ICD-10-CM | POA: Diagnosis not present

## 2015-10-02 DIAGNOSIS — S138XXD Sprain of joints and ligaments of other parts of neck, subsequent encounter: Secondary | ICD-10-CM | POA: Diagnosis not present

## 2015-10-16 DIAGNOSIS — S138XXD Sprain of joints and ligaments of other parts of neck, subsequent encounter: Secondary | ICD-10-CM | POA: Diagnosis not present

## 2015-10-16 DIAGNOSIS — M5481 Occipital neuralgia: Secondary | ICD-10-CM | POA: Diagnosis not present

## 2015-10-16 DIAGNOSIS — M62838 Other muscle spasm: Secondary | ICD-10-CM | POA: Diagnosis not present

## 2015-10-17 ENCOUNTER — Emergency Department (HOSPITAL_COMMUNITY)
Admission: EM | Admit: 2015-10-17 | Discharge: 2015-10-17 | Disposition: A | Discharge status: Home / Self Care | Payer: BLUE CROSS/BLUE SHIELD | Attending: Emergency Medicine | Admitting: Emergency Medicine

## 2015-10-17 ENCOUNTER — Encounter (HOSPITAL_COMMUNITY): Payer: Self-pay | Admitting: *Deleted

## 2015-10-17 DIAGNOSIS — R209 Unspecified disturbances of skin sensation: Secondary | ICD-10-CM | POA: Insufficient documentation

## 2015-10-17 DIAGNOSIS — F329 Major depressive disorder, single episode, unspecified: Secondary | ICD-10-CM | POA: Diagnosis not present

## 2015-10-17 DIAGNOSIS — Z791 Long term (current) use of non-steroidal anti-inflammatories (NSAID): Secondary | ICD-10-CM | POA: Diagnosis not present

## 2015-10-17 DIAGNOSIS — Z79899 Other long term (current) drug therapy: Secondary | ICD-10-CM | POA: Diagnosis not present

## 2015-10-17 DIAGNOSIS — R202 Paresthesia of skin: Secondary | ICD-10-CM | POA: Diagnosis not present

## 2015-10-17 DIAGNOSIS — G40909 Epilepsy, unspecified, not intractable, without status epilepticus: Secondary | ICD-10-CM | POA: Insufficient documentation

## 2015-10-17 DIAGNOSIS — Z793 Long term (current) use of hormonal contraceptives: Secondary | ICD-10-CM | POA: Insufficient documentation

## 2015-10-17 DIAGNOSIS — F17210 Nicotine dependence, cigarettes, uncomplicated: Secondary | ICD-10-CM | POA: Diagnosis not present

## 2015-10-17 DIAGNOSIS — Z792 Long term (current) use of antibiotics: Secondary | ICD-10-CM | POA: Diagnosis not present

## 2015-10-17 HISTORY — DX: Unspecified convulsions: R56.9

## 2015-10-17 HISTORY — DX: Other muscle spasm: M62.838

## 2015-10-17 HISTORY — DX: Strain of muscle, fascia and tendon at neck level, initial encounter: S16.1XXA

## 2015-10-17 NOTE — ED Notes (Signed)
Pt declined dc vitals.

## 2015-10-17 NOTE — ED Notes (Signed)
PA at bedside.

## 2015-10-17 NOTE — ED Provider Notes (Signed)
CSN: [id number]     Arrival date & time 10/17/15  1919 History   First MD Initiated Contact with Patient 10/17/15 2004     Chief Complaint  Patient presents with  . Tingling     (Consider location/radiation/quality/duration/timing/severity/associated sxs/prior Treatment) HPI Comments: Patient presents with complaint of tingling in the right leg since yesterday. No weakness, swelling, redness or injury. The tingling sensation goes down the posterior leg to the foot, affecting the plantar foot. She had similar but less intense symptoms in the right arm. She reports going to physical therapy for the past year for "neck spasms" and had a session of PT yesterday prior to symptoms. She also feels she has been stressed more than usual over the last 2 days. Today, the tingling persisted intermittently prompting ED evaluation. She took a muscle relaxer and a Klonopin without significant relief. During the time since arrival to the hospital, the tingling has started to affect the left arm as well. No change in her chronic symptoms of neck tightness or spasm.   The history is provided by the patient. No language interpreter was used.    Past Medical History  Diagnosis Date  . Depression   . Anxiety   . Neuromuscular disorder (HCC)     MTHFR gene controlled on methyl folate supp and vit B6 and 12  . Cervical strain   . Muscle spasms of neck   . Seizures (HCC)     pt reports seizures from pain   Past Surgical History  Procedure Laterality Date  . Wisdom tooth extraction     Family History  Problem Relation Age of Onset  . Depression Mother   . Hypertension Father   . Heart disease Maternal Grandmother    Social History  Substance Use Topics  . Smoking status: Light Tobacco Smoker    Types: Cigarettes  . Smokeless tobacco: Never Used  . Alcohol Use: 1.0 oz/week    2 Standard drinks or equivalent per week   OB History    No data available     Review of Systems  Constitutional:  Negative for fever and chills.  Gastrointestinal: Negative.  Negative for nausea, vomiting and abdominal pain.  Musculoskeletal: Negative for neck pain.       See HPI.  Skin: Negative.   Neurological: Positive for numbness (C/O tingling in extremities). Negative for tremors, weakness and headaches.      Allergies  Amoxapine and related and Penicillins  Home Medications   Prior to Admission medications   Medication Sig Start Date End Date Taking? Authorizing Provider  clonazePAM (KLONOPIN) 1 MG tablet Take 1 tablet (1 mg total) by mouth 2 (two) times daily as needed for anxiety. 07/11/15   Tonye Pearsonobert P Doolittle, MD  doxycycline (VIBRA-TABS) 100 MG tablet Take 1 tablet (100 mg total) by mouth 2 (two) times daily. Patient not taking: Reported on 10/04/2014 08/30/14   Tonye Pearsonobert P Doolittle, MD  FLUoxetine (PROZAC) 20 MG capsule Take 3 capsules (60 mg total) by mouth daily. 07/11/15   Tonye Pearsonobert P Doolittle, MD  ipratropium (ATROVENT) 0.03 % nasal spray Place 2 sprays into the nose 4 (four) times daily. Patient not taking: Reported on 08/29/2014 12/20/12   Sherren MochaEva N Shaw, MD  meloxicam (MOBIC) 15 MG tablet Take 1 tablet (15 mg total) by mouth daily. Patient not taking: Reported on 07/11/2015 10/04/14   Tonye Pearsonobert P Doolittle, MD  methocarbamol (ROBAXIN) 500 MG tablet Take 1 tablet (500 mg total) by mouth every 6 (six) hours as  needed for muscle spasms. 10/04/14   Tonye Pearson, MD  Multiple Vitamins-Minerals (MULTIVITAMIN WITH MINERALS) tablet Take 1 tablet by mouth daily.    Historical Provider, MD  nitrofurantoin, macrocrystal-monohydrate, (MACROBID) 100 MG capsule Take 1 capsule (100 mg total) by mouth 2 (two) times daily. 01/02/15   Raelyn Ensign, PA  Norgestimate-Ethinyl Estradiol Triphasic (ORTHO TRI-CYCLEN, 28,) 0.18/0.215/0.25 MG-35 MCG tablet Take 1 tablet by mouth daily.    Historical Provider, MD  zolpidem (AMBIEN) 5 MG tablet Take 1 tablet (5 mg total) by mouth at bedtime as needed. for sleep 07/11/15    Tonye Pearson, MD   BP 141/86 mmHg  Pulse 63  Temp(Src) 98.2 F (36.8 C) (Oral)  Resp 16  SpO2 100%  LMP 09/27/2015 Physical Exam  Constitutional: She is oriented to person, place, and time. She appears well-developed and well-nourished. No distress.  HENT:  Head: Normocephalic.  Neck: Normal range of motion. Neck supple.  Cardiovascular: Normal rate.   Pulmonary/Chest: Effort normal. No respiratory distress.  Abdominal: Soft. There is no tenderness.  Musculoskeletal:  No midline or paracervical tenderness. No reproducible radicular pain or tingling to palpation or through ROM of cervical spine.  Neurological: She is alert and oriented to person, place, and time. Coordination normal.  FROM and strength of extremities. Reflexes are hyperreflexic in lower extremities and equal bilaterally. CN's 3-12 grossly intact. Ambulatory without ataxia. Able to maneuver on and off the stretcher without discoordination or difficulty.  Psychiatric: She has a normal mood and affect.    ED Course  Procedures (including critical care time) Labs Review Labs Reviewed - No data to display  Imaging Review No results found. I have personally reviewed and evaluated these images and lab results as part of my medical decision-making.   EKG Interpretation None      MDM   Final diagnoses:  None    1. Paresthesias  Patient presents with tingling without weakness of right extremities (LE > UE) since yesterday. Symptoms have been intermittent. While here, started having tingling in the left arm. She is being treated with PT x 1 year for neck spasms. Last MR cervical spine in 2012 suggesting ongoing neck difficulty. Has never seen ortho or neuro. No longer has PCP who was managing issues. She has no neurologic deficits on exam. Discussed with Dr. Estell Harpin who agrees she is appropriate for discharge home without need for urgent MR cervical spine given distribution of symptoms. Will refer to  neurosurgery and/or orthopedics for further outpatient management of ongoing neck soreness and now paresthesias.     Elpidio Anis, PA-C 10/17/15 2051  Bethann Berkshire, MD 10/18/15 [phone]

## 2015-10-17 NOTE — ED Notes (Signed)
Pt states that she has had right sided tingling since yesterday; pt states that she was at physical therapy for neck and was advised that it was related to neck pain; pt continued to having rt sided tingling through the night and pt states that she became concerned and went to UC this afternoon; pt states that UC referred her here to have head CT; pt states that she is feeling anxious and feels like she may have a panic attack as well; no weakness noted; pt ambulatory with steady gait

## 2015-10-17 NOTE — Discharge Instructions (Signed)
Paresthesia Paresthesia is an abnormal burning or prickling sensation. This sensation is generally felt in the hands, arms, legs, or feet. However, it may occur in any part of the body. Usually, it is not painful. The feeling may be described as:  Tingling or numbness.  Pins and needles.  Skin crawling.  Buzzing.  Limbs falling asleep.  Itching. Most people experience temporary (transient) paresthesia at some time in their lives. Paresthesia may occur when you breathe too quickly (hyperventilation). It can also occur without any apparent cause. Commonly, paresthesia occurs when pressure is placed on a nerve. The sensation quickly goes away after the pressure is removed. For some people, however, paresthesia is a long-lasting (chronic) condition that is caused by an underlying disorder. If you continue to have paresthesia, you may need further medical evaluation. HOME CARE INSTRUCTIONS Watch your condition for any changes. Taking the following actions may help to lessen any discomfort that you are feeling:  Avoid drinking alcohol.  Try acupuncture or massage to help relieve your symptoms.  Keep all follow-up visits as directed by your health care provider. This is important. SEEK MEDICAL CARE IF:  You continue to have episodes of paresthesia.  Your burning or prickling feeling gets worse when you walk.  You have pain, cramps, or dizziness.  You develop a rash. SEEK IMMEDIATE MEDICAL CARE IF:  You feel weak.  You have trouble walking or moving.  You have problems with speech, understanding, or vision.  You feel confused.  You cannot control your bladder or bowel movements.  You have numbness after an injury.  You faint.   This information is not intended to replace advice given to you by your health care provider. Make sure you discuss any questions you have with your health care provider.   Document Released: 05/17/2002 Document Revised: 10/11/2014 Document Reviewed:  05/23/2014 Elsevier Interactive Patient Education 2016 Elsevier Inc.  

## 2015-10-23 DIAGNOSIS — R202 Paresthesia of skin: Secondary | ICD-10-CM | POA: Diagnosis not present

## 2015-10-23 DIAGNOSIS — R200 Anesthesia of skin: Secondary | ICD-10-CM | POA: Diagnosis not present

## 2015-10-31 DIAGNOSIS — M5481 Occipital neuralgia: Secondary | ICD-10-CM | POA: Diagnosis not present

## 2015-10-31 DIAGNOSIS — M62838 Other muscle spasm: Secondary | ICD-10-CM | POA: Diagnosis not present

## 2015-10-31 DIAGNOSIS — S138XXD Sprain of joints and ligaments of other parts of neck, subsequent encounter: Secondary | ICD-10-CM | POA: Diagnosis not present

## 2015-11-08 DIAGNOSIS — M62838 Other muscle spasm: Secondary | ICD-10-CM | POA: Diagnosis not present

## 2015-11-08 DIAGNOSIS — M5481 Occipital neuralgia: Secondary | ICD-10-CM | POA: Diagnosis not present

## 2015-11-08 DIAGNOSIS — S138XXD Sprain of joints and ligaments of other parts of neck, subsequent encounter: Secondary | ICD-10-CM | POA: Diagnosis not present

## 2015-11-13 DIAGNOSIS — S138XXD Sprain of joints and ligaments of other parts of neck, subsequent encounter: Secondary | ICD-10-CM | POA: Diagnosis not present

## 2015-11-13 DIAGNOSIS — M62838 Other muscle spasm: Secondary | ICD-10-CM | POA: Diagnosis not present

## 2015-11-13 DIAGNOSIS — M5481 Occipital neuralgia: Secondary | ICD-10-CM | POA: Diagnosis not present

## 2015-11-27 DIAGNOSIS — S138XXD Sprain of joints and ligaments of other parts of neck, subsequent encounter: Secondary | ICD-10-CM | POA: Diagnosis not present

## 2015-11-27 DIAGNOSIS — M5481 Occipital neuralgia: Secondary | ICD-10-CM | POA: Diagnosis not present

## 2015-11-27 DIAGNOSIS — M62838 Other muscle spasm: Secondary | ICD-10-CM | POA: Diagnosis not present

## 2015-12-13 DIAGNOSIS — L700 Acne vulgaris: Secondary | ICD-10-CM | POA: Diagnosis not present

## 2015-12-13 DIAGNOSIS — M62838 Other muscle spasm: Secondary | ICD-10-CM | POA: Diagnosis not present

## 2015-12-13 DIAGNOSIS — L818 Other specified disorders of pigmentation: Secondary | ICD-10-CM | POA: Diagnosis not present

## 2015-12-13 DIAGNOSIS — M5481 Occipital neuralgia: Secondary | ICD-10-CM | POA: Diagnosis not present

## 2015-12-13 DIAGNOSIS — S138XXD Sprain of joints and ligaments of other parts of neck, subsequent encounter: Secondary | ICD-10-CM | POA: Diagnosis not present

## 2015-12-25 DIAGNOSIS — M5481 Occipital neuralgia: Secondary | ICD-10-CM | POA: Diagnosis not present

## 2015-12-25 DIAGNOSIS — M62838 Other muscle spasm: Secondary | ICD-10-CM | POA: Diagnosis not present

## 2015-12-25 DIAGNOSIS — S138XXD Sprain of joints and ligaments of other parts of neck, subsequent encounter: Secondary | ICD-10-CM | POA: Diagnosis not present

## 2016-01-08 DIAGNOSIS — M62838 Other muscle spasm: Secondary | ICD-10-CM | POA: Diagnosis not present

## 2016-01-08 DIAGNOSIS — S138XXD Sprain of joints and ligaments of other parts of neck, subsequent encounter: Secondary | ICD-10-CM | POA: Diagnosis not present

## 2016-01-08 DIAGNOSIS — M5481 Occipital neuralgia: Secondary | ICD-10-CM | POA: Diagnosis not present

## 2016-01-22 DIAGNOSIS — M5481 Occipital neuralgia: Secondary | ICD-10-CM | POA: Diagnosis not present

## 2016-01-22 DIAGNOSIS — S138XXD Sprain of joints and ligaments of other parts of neck, subsequent encounter: Secondary | ICD-10-CM | POA: Diagnosis not present

## 2016-01-22 DIAGNOSIS — M62838 Other muscle spasm: Secondary | ICD-10-CM | POA: Diagnosis not present

## 2016-01-23 ENCOUNTER — Other Ambulatory Visit: Payer: Self-pay

## 2016-01-23 DIAGNOSIS — F411 Generalized anxiety disorder: Secondary | ICD-10-CM

## 2016-01-23 NOTE — Telephone Encounter (Addendum)
Pharm reqs RFs of clonazepam and zolpidem. Merla RichesDoolittle pt last seen 07/11/15, given 6 mos of RFs. Pended w/note to RTC to est care w/new provider.

## 2016-01-24 ENCOUNTER — Telehealth: Payer: Self-pay

## 2016-01-25 ENCOUNTER — Other Ambulatory Visit: Payer: Self-pay | Admitting: Physician Assistant

## 2016-01-25 MED ORDER — ZOLPIDEM TARTRATE 5 MG PO TABS: 5.0000 mg | ORAL_TABLET | Freq: Every evening | ORAL | 0 refills | Status: DC | PRN

## 2016-01-25 MED ORDER — CLONAZEPAM 1 MG PO TABS: 1.0000 mg | ORAL_TABLET | Freq: Two times a day (BID) | ORAL | 0 refills | Status: DC | PRN

## 2016-01-25 NOTE — Telephone Encounter (Signed)
Dr. Merla Richesoolittle is now retired. Patient needs to re-establish care and may need to managed by a specialist since she is on 2 controlled substances. For now I will provide a one time refill for 30 days.

## 2016-01-25 NOTE — Telephone Encounter (Signed)
She should return to clinic for follow up.  She has also had a recent hospitalization 3 months ago for paresthesia.  Please alert her to come in.

## 2016-01-25 NOTE — Telephone Encounter (Signed)
Advised pt to come in and est care w/new provider and explained new same day appts. Pt agreed and I transferred her to clerical to set up appt.

## 2016-01-26 ENCOUNTER — Encounter: Payer: Self-pay | Admitting: Physician Assistant

## 2016-01-26 ENCOUNTER — Ambulatory Visit (INDEPENDENT_AMBULATORY_CARE_PROVIDER_SITE_OTHER): Payer: BLUE CROSS/BLUE SHIELD | Admitting: Physician Assistant

## 2016-01-26 DIAGNOSIS — F411 Generalized anxiety disorder: Secondary | ICD-10-CM | POA: Diagnosis not present

## 2016-01-26 DIAGNOSIS — F418 Other specified anxiety disorders: Secondary | ICD-10-CM

## 2016-01-26 MED ORDER — CLONAZEPAM 1 MG PO TABS: 1.0000 mg | ORAL_TABLET | Freq: Every evening | ORAL | 0 refills | Status: AC | PRN

## 2016-01-26 MED ORDER — CLONAZEPAM 1 MG PO TABS: 1.0000 mg | ORAL_TABLET | Freq: Every evening | ORAL | 0 refills | Status: DC | PRN

## 2016-01-26 NOTE — Progress Notes (Signed)
Patient ID: Stefanie Holder, female   DOB: 1985-07-02, 30 y.o.   MRN: [id number] [facility] [address], [place] [id number] [phone]  Date:  01/26/2016   Name:  Stefanie Holder   DOB:  1985-07-02   MRN:  [id number]  PCP:  No PCP Per Patient   By signing my name below, I, Charline BillsEssence Howell, attest that this documentation has been prepared under the direction and in the presence of Trena PlattStephanie English, PA-C Electronically Signed: Charline BillsEssence Howell, ED Scribe 01/26/2016 at 5:05 PM.  History of Present Illness:  Stefanie Holder is a 30 y.o. female patient, with a h/o anxiety, depression and insomnia, who [facility] medication refills of Klonopin and Ambien. Pt still has Prozac left since she typically gets a 3 month supply. She has been on Prozac since high school. She takes 1/3 or 1/2 Ambien nightly for almost a year; states her "mind does not shut off". Pt denies night terrors. She takes Klonopin nightly before bed. Pt states that her anxiety has gotten worse and she worries about everything. Her husband plans to travel to [place] for the [place] eclipse in 3 days and she is already experiencing anxiety about it.   Patient Active Problem List   Diagnosis Date Noted   Generalized anxiety disorder 08/21/2012   Depression with anxiety 08/21/2012   Insomnia 08/21/2012    Past Medical History:  Diagnosis Date   Anxiety    Cervical strain    Depression    Muscle spasms of neck    Neuromuscular disorder (HCC)    MTHFR gene controlled on methyl folate supp and vit B6 and 12   Seizures (HCC)    pt reports seizures from pain    Past Surgical History:  Procedure Laterality Date   WISDOM TOOTH EXTRACTION      Social History  Substance Use Topics   Smoking status: Light Tobacco Smoker    Types: Cigarettes   Smokeless tobacco: Never Used   Alcohol use 1.0 oz/week    2 Standard drinks or equivalent per week    Family History  Problem Relation Age  of Onset   Depression Mother    Hypertension Father    Heart disease Maternal Grandmother     Allergies  Allergen Reactions   Amoxapine And Related    Penicillins     Medication list has been reviewed and updated.  Current Outpatient Prescriptions on File Prior to Visit  Medication Sig Dispense Refill   clonazePAM (KLONOPIN) 1 MG tablet Take 1 tablet (1 mg total) by mouth 2 (two) times daily as needed for anxiety. 30 tablet 0   FLUoxetine (PROZAC) 20 MG capsule Take 3 capsules (60 mg total) by mouth daily. 270 capsule 3   Multiple Vitamins-Minerals (MULTIVITAMIN WITH MINERALS) tablet Take 1 tablet by mouth daily.     Norgestimate-Ethinyl Estradiol Triphasic (ORTHO TRI-CYCLEN, 28,) 0.18/0.215/0.25 MG-35 MCG tablet Take 1 tablet by mouth daily.     zolpidem (AMBIEN) 5 MG tablet Take 1 tablet (5 mg total) by mouth at bedtime as needed. for sleep 30 tablet 0   ipratropium (ATROVENT) 0.03 % nasal spray Place 2 sprays into the nose 4 (four) times daily. (Patient not taking: Reported on 08/29/2014) 30 mL 1   meloxicam (MOBIC) 15 MG tablet Take 1 tablet (15 mg total) by mouth daily. (Patient not taking: Reported on 07/11/2015) 30 tablet 1   methocarbamol (ROBAXIN) 500 MG tablet Take 1 tablet (500  mg total) by mouth every 6 (six) hours as needed for muscle spasms. (Patient not taking: Reported on 01/26/2016) 30 tablet 1   No current facility-administered medications on file prior to visit.     Review of Systems  Psychiatric/Behavioral: The patient is nervous/anxious and has insomnia.     Physical Examination: BP 116/78 (BP Location: Right Arm, Patient Position: Sitting, Cuff Size: Small)    Pulse 70    Temp 98.8 F (37.1 C) (Oral)    Resp 18    Ht 5' 5.35" (1.66 m)    Wt 149 lb (67.6 kg)    LMP 01/17/2016    SpO2 100%    BMI 24.53 kg/m  Physical Exam  Constitutional: She is oriented to person, place, and time. She appears well-developed and well-nourished. No distress.  HENT:   Head: Normocephalic and atraumatic.  Right Ear: External ear normal.  Left Ear: External ear normal.  Eyes: Conjunctivae and EOM are normal. Pupils are equal, round, and reactive to light.  Cardiovascular: Normal rate.   Pulmonary/Chest: Effort normal. No respiratory distress.  Neurological: She is alert and oriented to person, place, and time.  Skin: She is not diaphoretic.  Psychiatric: She has a normal mood and affect. Her behavior is normal.    Assessment and Plan: Stefanie Holder is a 30 y.o. female who is here today for chief complaint of depression and anxiety. Patient is on 2 benzodiazepines at this time. I have advised that we should stop one of these. Patient has chosen to stop the [place], which I believe is suitable. We can refill the Prozac for 6 months. Have advised her to report back in 2 weeks if this change is not working for her. Depression with anxiety - Plan: clonazePAM (KLONOPIN) 1 MG tablet, DISCONTINUED: clonazePAM (KLONOPIN) 1 MG tablet, DISCONTINUED: clonazePAM (KLONOPIN) 1 MG tablet  Generalized anxiety disorder - Plan: clonazePAM (KLONOPIN) 1 MG tablet, DISCONTINUED: clonazePAM (KLONOPIN) 1 MG tablet, DISCONTINUED: clonazePAM (KLONOPIN) 1 MG tablet  Trena PlattStephanie English, PA-C [facility] 01/26/2016 5:05 PM

## 2016-01-26 NOTE — Patient Instructions (Signed)
Please take three capsules by day, and 1 capsule in the evening. We can continue to take the klonopin at night.  But will stop the [place].  Please follow up with me in 2 weeks, or later if this medication is working.  I will refill the prozac for 6 months.

## 2016-01-26 NOTE — Telephone Encounter (Signed)
Faxed. Tried to call pt but VM was full and couldn't leave message. I wrote note on Rx asking pharm to give pt the message written in "comments" on Rx. If pt sees missed call and calls back, please give her Mani's message.

## 2016-02-05 DIAGNOSIS — S138XXD Sprain of joints and ligaments of other parts of neck, subsequent encounter: Secondary | ICD-10-CM | POA: Diagnosis not present

## 2016-02-05 DIAGNOSIS — M62838 Other muscle spasm: Secondary | ICD-10-CM | POA: Diagnosis not present

## 2016-02-05 DIAGNOSIS — M5481 Occipital neuralgia: Secondary | ICD-10-CM | POA: Diagnosis not present

## 2016-02-19 DIAGNOSIS — M62838 Other muscle spasm: Secondary | ICD-10-CM | POA: Diagnosis not present

## 2016-02-19 DIAGNOSIS — M5481 Occipital neuralgia: Secondary | ICD-10-CM | POA: Diagnosis not present

## 2016-02-19 DIAGNOSIS — S138XXD Sprain of joints and ligaments of other parts of neck, subsequent encounter: Secondary | ICD-10-CM | POA: Diagnosis not present

## 2016-02-21 DIAGNOSIS — Z1389 Encounter for screening for other disorder: Secondary | ICD-10-CM | POA: Diagnosis not present

## 2016-02-21 DIAGNOSIS — Z01419 Encounter for gynecological examination (general) (routine) without abnormal findings: Secondary | ICD-10-CM | POA: Diagnosis not present

## 2016-02-21 DIAGNOSIS — Z6825 Body mass index (BMI) 25.0-25.9, adult: Secondary | ICD-10-CM | POA: Diagnosis not present

## 2016-02-21 DIAGNOSIS — Z130 Encounter for screening for diseases of the blood and blood-forming organs and certain disorders involving the immune mechanism: Secondary | ICD-10-CM | POA: Diagnosis not present

## 2016-02-23 DIAGNOSIS — Z23 Encounter for immunization: Secondary | ICD-10-CM | POA: Diagnosis not present

## 2016-03-06 DIAGNOSIS — S138XXD Sprain of joints and ligaments of other parts of neck, subsequent encounter: Secondary | ICD-10-CM | POA: Diagnosis not present

## 2016-03-06 DIAGNOSIS — M5481 Occipital neuralgia: Secondary | ICD-10-CM | POA: Diagnosis not present

## 2016-03-06 DIAGNOSIS — M62838 Other muscle spasm: Secondary | ICD-10-CM | POA: Diagnosis not present

## 2016-03-18 DIAGNOSIS — S138XXD Sprain of joints and ligaments of other parts of neck, subsequent encounter: Secondary | ICD-10-CM | POA: Diagnosis not present

## 2016-03-18 DIAGNOSIS — M62838 Other muscle spasm: Secondary | ICD-10-CM | POA: Diagnosis not present

## 2016-03-18 DIAGNOSIS — M5481 Occipital neuralgia: Secondary | ICD-10-CM | POA: Diagnosis not present

## 2016-04-01 ENCOUNTER — Ambulatory Visit (INDEPENDENT_AMBULATORY_CARE_PROVIDER_SITE_OTHER): Payer: BLUE CROSS/BLUE SHIELD

## 2016-04-01 ENCOUNTER — Other Ambulatory Visit: Payer: Self-pay | Admitting: Urgent Care

## 2016-04-01 ENCOUNTER — Ambulatory Visit (INDEPENDENT_AMBULATORY_CARE_PROVIDER_SITE_OTHER): Payer: BLUE CROSS/BLUE SHIELD | Admitting: Urgent Care

## 2016-04-01 VITALS — BP 122/72 | HR 65 | Temp 98.3°F | Resp 17 | Ht 64.5 in | Wt 146.0 lb

## 2016-04-01 DIAGNOSIS — R195 Other fecal abnormalities: Secondary | ICD-10-CM | POA: Diagnosis not present

## 2016-04-01 DIAGNOSIS — R102 Pelvic and perineal pain: Secondary | ICD-10-CM

## 2016-04-01 DIAGNOSIS — R1030 Lower abdominal pain, unspecified: Secondary | ICD-10-CM | POA: Diagnosis not present

## 2016-04-01 DIAGNOSIS — R109 Unspecified abdominal pain: Secondary | ICD-10-CM | POA: Diagnosis not present

## 2016-04-01 DIAGNOSIS — Z3041 Encounter for surveillance of contraceptive pills: Secondary | ICD-10-CM

## 2016-04-01 DIAGNOSIS — M545 Low back pain, unspecified: Secondary | ICD-10-CM

## 2016-04-01 DIAGNOSIS — Z79899 Other long term (current) drug therapy: Secondary | ICD-10-CM | POA: Diagnosis not present

## 2016-04-01 LAB — POCT CBC
Granulocyte percent: 56.4 %G (ref 37–80)
HCT, POC: 35.2 % — AB (ref 37.7–47.9)
HEMOGLOBIN: 12.8 g/dL (ref 12.2–16.2)
LYMPH, POC: 1.9 (ref 0.6–3.4)
MCH: 32.5 pg — AB (ref 27–31.2)
MCHC: 36.3 g/dL — AB (ref 31.8–35.4)
MCV: 89.4 fL (ref 80–97)
MID (cbc): 0.4 (ref 0–0.9)
MPV: 8.6 fL (ref 0–99.8)
PLATELET COUNT, POC: 223 10*3/uL (ref 142–424)
POC Granulocyte: 3 (ref 2–6.9)
POC LYMPH PERCENT: 36.2 %L (ref 10–50)
POC MID %: 7.4 %M (ref 0–12)
RBC: 3.94 M/uL — AB (ref 4.04–5.48)
RDW, POC: 12.3 %
WBC: 5.3 10*3/uL (ref 4.6–10.2)

## 2016-04-01 LAB — BASIC METABOLIC PANEL
BUN: 9 mg/dL (ref 7–25)
CHLORIDE: 103 mmol/L (ref 98–110)
CO2: 26 mmol/L (ref 20–31)
Calcium: 10 mg/dL (ref 8.6–10.2)
Creat: 0.76 mg/dL (ref 0.50–1.10)
Glucose, Bld: 99 mg/dL (ref 65–99)
POTASSIUM: 4.5 mmol/L (ref 3.5–5.3)
Sodium: 137 mmol/L (ref 135–146)

## 2016-04-01 LAB — POCT URINALYSIS DIP (MANUAL ENTRY)
BILIRUBIN UA: NEGATIVE
BILIRUBIN UA: NEGATIVE
Glucose, UA: NEGATIVE
LEUKOCYTES UA: NEGATIVE
NITRITE UA: NEGATIVE
PH UA: 6.5
PROTEIN UA: NEGATIVE
Spec Grav, UA: <=1.005
Urobilinogen, UA: 0.2

## 2016-04-01 LAB — POC MICROSCOPIC URINALYSIS (UMFC): MUCUS RE: ABSENT

## 2016-04-01 LAB — POCT URINE PREGNANCY: PREG TEST UR: NEGATIVE

## 2016-04-01 MED ORDER — ACETAMINOPHEN-CODEINE #3 300-30 MG PO TABS: 1.0000 | ORAL_TABLET | ORAL | 0 refills | Status: DC | PRN

## 2016-04-01 NOTE — Patient Instructions (Addendum)
Kidney Stones Kidney stones (urolithiasis) are deposits that form inside your kidneys. The intense pain is caused by the stone moving through the urinary tract. When the stone moves, the ureter goes into spasm around the stone. The stone is usually passed in the urine.  CAUSES   A disorder that makes certain neck glands produce too much parathyroid hormone (primary hyperparathyroidism).  A buildup of uric acid crystals, similar to gout in your joints.  Narrowing (stricture) of the ureter.  A kidney obstruction present at birth (congenital obstruction).  Previous surgery on the kidney or ureters.  Numerous kidney infections. SYMPTOMS   Feeling sick to your stomach (nauseous).  Throwing up (vomiting).  Blood in the urine (hematuria).  Pain that usually spreads (radiates) to the groin.  Frequency or urgency of urination. DIAGNOSIS   Taking a history and physical exam.  Blood or urine tests.  CT scan.  Occasionally, an examination of the inside of the urinary bladder (cystoscopy) is performed. TREATMENT   Observation.  Increasing your fluid intake.  Extracorporeal shock wave lithotripsy--This is a noninvasive procedure that uses shock waves to break up kidney stones.  Surgery may be needed if you have severe pain or persistent obstruction. There are various surgical procedures. Most of the procedures are performed with the use of small instruments. Only small incisions are needed to accommodate these instruments, so recovery time is minimized. The size, location, and chemical composition are all important variables that will determine the proper choice of action for you. Talk to your health care provider to better understand your situation so that you will minimize the risk of injury to yourself and your kidney.  HOME CARE INSTRUCTIONS   Drink enough water and fluids to keep your urine clear or pale yellow. This will help you to pass the stone or stone fragments.  Strain  all urine through the provided strainer. Keep all particulate matter and stones for your health care provider to see. The stone causing the pain may be as small as a grain of salt. It is very important to use the strainer each and every time you pass your urine. The collection of your stone will allow your health care provider to analyze it and verify that a stone has actually passed. The stone analysis will often identify what you can do to reduce the incidence of recurrences.  Only take over-the-counter or prescription medicines for pain, discomfort, or fever as directed by your health care provider.  Keep all follow-up visits as told by your health care provider. This is important.  Get follow-up X-rays if required. The absence of pain does not always mean that the stone has passed. It may have only stopped moving. If the urine remains completely obstructed, it can cause loss of kidney function or even complete destruction of the kidney. It is your responsibility to make sure X-rays and follow-ups are completed. Ultrasounds of the kidney can show blockages and the status of the kidney. Ultrasounds are not associated with any radiation and can be performed easily in a matter of minutes.  Make changes to your daily diet as told by your health care provider. You may be told to:  Limit the amount of salt that you eat.  Eat 5 or more servings of fruits and vegetables each day.  Limit the amount of meat, poultry, fish, and eggs that you eat.  Collect a 24-hour urine sample as told by your health care provider.You may need to collect another urine sample every 6-12   months. SEEK MEDICAL CARE IF:  You experience pain that is progressive and unresponsive to any pain medicine you have been prescribed. SEEK IMMEDIATE MEDICAL CARE IF:   Pain cannot be controlled with the prescribed medicine.  You have a fever or shaking chills.  The severity or intensity of pain increases over 18 hours and is not  relieved by pain medicine.  You develop a new onset of abdominal pain.  You feel faint or pass out.  You are unable to urinate.   This information is not intended to replace advice given to you by your health care provider. Make sure you discuss any questions you have with your health care provider.   Document Released: 05/27/2005 Document Revised: 02/15/2015 Document Reviewed: 10/28/2012 Elsevier Interactive Patient Education 2016 [employer]     IF you received an x-ray today, you will receive an invoice from [facility]. Please contact [facility] at [phone] with questions or concerns regarding your invoice.   IF you received labwork today, you will receive an invoice from [facility]. Please contact [facility] at [phone] with questions or concerns regarding your invoice.   Our billing staff will not be able to assist you with questions regarding bills from these companies.  You will be contacted with the lab results as soon as they are available. The fastest way to get your results is to activate your My Chart account. Instructions are located on the last page of this paperwork. If you have not heard from us regarding the results in 2 weeks, please contact this office.

## 2016-04-01 NOTE — Progress Notes (Addendum)
Patient ID: Stefanie Holder, female   DOB: 10/12/1985, 30 y.o.   MRN: [id number]   By signing my name below, I, Stefanie Holder, attest that this documentation has been prepared under the direction and in the presence of Stefanie Bamberg, PA-C Electronically Signed: Charline Holder, ED Scribe 04/01/2016 at 10:56 AM. MRN: [id number] DOB: [birth year]  Subjective:   Stefanie Holder is a 30 y.o. female presenting for chief complaint of mid pelvic discomfort for the past 2 days. Pt describes pain as a cramping sensation. She reports associated symptoms of low back pain onset this morning, chills, subjective fever, nausea, mild dizziness, dark stools x2 days and has been alternating between hard and loose stools for the past week. She reports 1 hard stool yesterday and 2 episodes of diarrhea this morning. Pt has tried Pepto-Bismol without significant relief. She denies bright red blood in stools, vomiting, hematuria, urinary frequency, vaginal dysuria, genital rashes. Pt's LMP was 2 weeks ago. She reports unprotected sexual activity with her husband and is currently taking OCP. Of note, patient has longstanding history of neck discomfort, takes NSAIDs every day. Work up has been negative including x-rays, MRIs. She also has longstanding history of anxiety managed with Prozac, clonazepam. Admits she may have depression.   Current Outpatient Prescriptions:    clonazePAM (KLONOPIN) 1 MG tablet, Take 1 tablet (1 mg total) by mouth at bedtime as needed for anxiety. Fill after 60 days, Disp: 30 tablet, Rfl: 0   FLUoxetine (PROZAC) 20 MG capsule, Take 3 capsules (60 mg total) by mouth daily., Disp: 270 capsule, Rfl: 3   ipratropium (ATROVENT) 0.03 % nasal spray, Place 2 sprays into the nose 4 (four) times daily., Disp: 30 mL, Rfl: 1   methocarbamol (ROBAXIN) 500 MG tablet, Take 1 tablet (500 mg total) by mouth every 6 (six) hours as needed for muscle spasms., Disp: 30 tablet, Rfl: 1   Multiple Vitamins-Minerals (MULTIVITAMIN  WITH MINERALS) tablet, Take 1 tablet by mouth daily., Disp: , Rfl:    Norgestimate-Ethinyl Estradiol Triphasic (ORTHO TRI-CYCLEN, 28,) 0.18/0.215/0.25 MG-35 MCG tablet, Take 1 tablet by mouth daily., Disp: , Rfl:    zolpidem (AMBIEN) 5 MG tablet, Take 1 tablet (5 mg total) by mouth at bedtime as needed. for sleep, Disp: 30 tablet, Rfl: 0   Also is allergic to amoxapine and related and penicillins.  Stefanie Holder  has a past medical history of Anxiety; Cervical strain; Depression; Muscle spasms of neck; Neuromuscular disorder (HCC); and Seizures (HCC). Also  has a past surgical history that includes Wisdom tooth extraction.  Objective:   Vitals: BP 122/72 (BP Location: Right Arm, Patient Position: Sitting, Cuff Size: Normal)    Pulse 65    Temp 98.3 F (36.8 C) (Oral)    Resp 17    Ht 5' 4.5" (1.638 m)    Wt 146 lb (66.2 kg)    LMP 03/18/2016 (Approximate)    SpO2 97%    BMI 24.67 kg/m   Physical Exam  Constitutional: She is oriented to person, place, and time. She appears well-developed and well-nourished.  HENT:  Mouth/Throat: Oropharynx is clear and moist.  Eyes: No scleral icterus.  Cardiovascular: Normal rate, regular rhythm and intact distal pulses.  Exam reveals no gallop and no friction rub.   No murmur heard. Pulmonary/Chest: No respiratory distress. She has no wheezes. She has no rales.  Abdominal: Soft. Bowel sounds are normal. She exhibits no distension and no mass. There is tenderness (generalized, R and mid-pelvic > L). There is  no guarding.  No CVA tenderness.  Neurological: She is alert and oriented to person, place, and time.  Skin: Skin is warm and dry.   Results for orders placed or performed in visit on 04/01/16 (from the past 24 hour(s))  POCT urinalysis dipstick     Status: Abnormal   Collection Time: 04/01/16 11:23 AM  Result Value Ref Range   Color, UA yellow yellow   Clarity, UA clear clear   Glucose, UA negative negative   Bilirubin, UA negative negative    Ketones, POC UA negative negative   Spec Grav, UA <=1.005    Blood, UA trace-intact (A) negative   pH, UA 6.5    Protein Ur, POC negative negative   Urobilinogen, UA 0.2    Nitrite, UA Negative Negative   Leukocytes, UA Negative Negative  POCT urine pregnancy     Status: None   Collection Time: 04/01/16 11:24 AM  Result Value Ref Range   Preg Test, Ur Negative Negative  POCT Microscopic Urinalysis ([facility])     Status: None   Collection Time: 04/01/16 11:25 AM  Result Value Ref Range   WBC,UR,HPF,POC None None WBC/hpf   RBC,UR,HPF,POC None None RBC/hpf   Bacteria None None, Too numerous to count   Mucus Absent Absent   Epithelial Cells, UR Per Microscopy None None, Too numerous to count cells/hpf  POCT CBC     Status: Abnormal   Collection Time: 04/01/16 11:25 AM  Result Value Ref Range   WBC 5.3 4.6 - 10.2 K/uL   Lymph, poc 1.9 0.6 - 3.4   POC LYMPH PERCENT 36.2 10 - 50 %L   MID (cbc) 0.4 0 - 0.9   POC MID % 7.4 0 - 12 %M   POC Granulocyte 3.0 2 - 6.9   Granulocyte percent 56.4 37 - 80 %G   RBC 3.94 (A) 4.04 - 5.48 M/uL   Hemoglobin 12.8 12.2 - 16.2 g/dL   HCT, POC 16.135.2 (A) 09.637.7 - 47.9 %   MCV 89.4 80 - 97 fL   MCH, POC 32.5 (A) 27 - 31.2 pg   MCHC 36.3 (A) 31.8 - 35.4 g/dL   RDW, POC 04.512.3 %   Platelet Count, POC 223 142 - 424 K/uL   MPV 8.6 0 - 99.8 fL   Dg Abd 1 View  Result Date: 04/01/2016 CLINICAL DATA:  Lower abdominal pain for 5 days. EXAM: ABDOMEN - 1 VIEW COMPARISON:  None. FINDINGS: Small calcified phlebolith in the left lower pelvis. There is a non obstructive bowel gas pattern. No supine evidence of free air. No organomegaly or suspicious calcification.No acute bony abnormality. IMPRESSION: Negative. Electronically Signed   By: Charlett NoseKevin  Dover M.D.   On: 04/01/2016 11:46     Assessment and Plan :   1. Pelvic pain in female 2. Right sided abdominal pain 3. Loose stools 4. Dark stools - Discussed differential with patient including chronic NSAID use, ureteral  stone, gynecologic source (cyst, fibroid). For now, I advised patient cut back on her use of NSAIDs. Use Tylenol #3 for now. Hydrate aggressively. Consider referral to GI, labs pending. Consider trans-vaginal and pelvic U/S if symptoms worsen including vaginal bleeding, fever, worsening pelvic pain. Patient verbalized understanding.   5. Bilateral low back pain without sciatica, unspecified chronicity - Unclear etiology, will monitor. Consider working up her back and neck pain in the future to address this as a source of her NSAID use and potential for PUD.  6. Oral contraceptive use - Continue  current regimen.  Stefanie Bamberg, PA-C [facility] [phone] 04/01/2016 10:55 AM

## 2016-04-03 ENCOUNTER — Telehealth: Payer: Self-pay

## 2016-04-03 DIAGNOSIS — S138XXD Sprain of joints and ligaments of other parts of neck, subsequent encounter: Secondary | ICD-10-CM | POA: Diagnosis not present

## 2016-04-03 DIAGNOSIS — M5481 Occipital neuralgia: Secondary | ICD-10-CM | POA: Diagnosis not present

## 2016-04-03 DIAGNOSIS — M62838 Other muscle spasm: Secondary | ICD-10-CM | POA: Diagnosis not present

## 2016-04-03 NOTE — Telephone Encounter (Signed)
PATIENT STATES SHE SAW MARIO MANI ON Monday FOR A POSSIBLE KIDNEY STONE. SHE WOULD LIKE TO GET HER LAB RESULTS PLEASE. BEST PHONE [phone] (CELL)  PHARMACY CHOICE IS [facility] ON [address]  MBC

## 2016-04-03 NOTE — Telephone Encounter (Signed)
Attempted to call pt about her labs. No answer and voice mailbox was full.

## 2016-04-03 NOTE — Telephone Encounter (Signed)
Spoke with pt. Discussed labs and xrays. She will call if she needs anything.

## 2016-04-04 LAB — POC HEMOCCULT BLD/STL (HOME/3-CARD/SCREEN)
FECAL OCCULT BLD: NEGATIVE
FECAL OCCULT BLD: NEGATIVE
FECAL OCCULT BLD: NEGATIVE

## 2016-04-04 NOTE — Addendum Note (Signed)
Addended by: Thelma BargeICHARDSON, Bucky Grigg D on: 04/04/2016 02:09 PM   Modules accepted: Orders

## 2016-04-08 LAB — HEPATIC FUNCTION PANEL
ALK PHOS: 38 U/L (ref 33–115)
ALT: 10 U/L (ref 6–29)
AST: 13 U/L (ref 10–30)
Albumin: 4.3 g/dL (ref 3.6–5.1)
BILIRUBIN DIRECT: 0 mg/dL (ref <=0.2)
BILIRUBIN INDIRECT: 0.2 mg/dL (ref 0.2–1.2)
BILIRUBIN TOTAL: 0.2 mg/dL (ref 0.2–1.2)
Total Protein: 7.2 g/dL (ref 6.1–8.1)

## 2016-04-09 LAB — THYROID PANEL WITH TSH
Free Thyroxine Index: 2.4 (ref 1.4–3.8)
T3 UPTAKE: 23 % (ref 22–35)
T4, Total: 10.4 ug/dL (ref 4.5–12.0)
TSH: 1.2 mIU/L

## 2016-04-10 ENCOUNTER — Encounter: Payer: Self-pay | Admitting: Urgent Care

## 2016-04-15 DIAGNOSIS — M62838 Other muscle spasm: Secondary | ICD-10-CM | POA: Diagnosis not present

## 2016-04-15 DIAGNOSIS — M5481 Occipital neuralgia: Secondary | ICD-10-CM | POA: Diagnosis not present

## 2016-04-15 DIAGNOSIS — S138XXD Sprain of joints and ligaments of other parts of neck, subsequent encounter: Secondary | ICD-10-CM | POA: Diagnosis not present

## 2016-04-17 ENCOUNTER — Ambulatory Visit (INDEPENDENT_AMBULATORY_CARE_PROVIDER_SITE_OTHER): Payer: BLUE CROSS/BLUE SHIELD | Admitting: Family Medicine

## 2016-04-17 VITALS — BP 122/72 | HR 64 | Temp 98.2°F | Resp 17 | Ht 65.0 in | Wt 149.0 lb

## 2016-04-17 DIAGNOSIS — R05 Cough: Secondary | ICD-10-CM | POA: Diagnosis not present

## 2016-04-17 DIAGNOSIS — J0100 Acute maxillary sinusitis, unspecified: Secondary | ICD-10-CM | POA: Diagnosis not present

## 2016-04-17 DIAGNOSIS — R5383 Other fatigue: Secondary | ICD-10-CM

## 2016-04-17 DIAGNOSIS — R059 Cough, unspecified: Secondary | ICD-10-CM

## 2016-04-17 LAB — POCT INFLUENZA A/B
Influenza A, POC: NEGATIVE
Influenza B, POC: NEGATIVE

## 2016-04-17 MED ORDER — BENZONATATE 100 MG PO CAPS: 100.0000 mg | ORAL_CAPSULE | Freq: Three times a day (TID) | ORAL | 0 refills | Status: DC | PRN

## 2016-04-17 MED ORDER — DOXYCYCLINE HYCLATE 100 MG PO CAPS: 100.0000 mg | ORAL_CAPSULE | Freq: Two times a day (BID) | ORAL | 0 refills | Status: DC

## 2016-04-17 MED ORDER — HYDROCOD POLST-CPM POLST ER 10-8 MG/5ML PO SUER
5.0000 mL | Freq: Two times a day (BID) | ORAL | 0 refills | Status: DC | PRN
Start: 2016-04-17 — End: 2017-01-30

## 2016-04-17 NOTE — Patient Instructions (Addendum)
Sinusitis  Start Doxycycline 100 mg twice daily for 10 days.  Cough   Take Tussionex 5 ml at bedtime or when not driving. Increases drowsiness.  Take Benzonatate 100-200 mg for cough during day and while at work  American ExpressKimberly S. Tiburcio PeaHarris, MSN, FNP-C [facility]  IF you received an x-ray today, you will receive an invoice from [facility]. Please contact [facility] at [phone] with questions or concerns regarding your invoice.   IF you received labwork today, you will receive an invoice from [facility]. Please contact [facility] at [phone] with questions or concerns regarding your invoice.   Our billing staff will not be able to assist you with questions regarding bills from these companies.  You will be contacted with the lab results as soon as they are available. The fastest way to get your results is to activate your My Chart account. Instructions are located on the last page of this paperwork. If you have not heard from us regarding the results in 2 weeks, please contact this office.      Sinusitis, Adult Sinusitis is redness, soreness, and inflammation of the paranasal sinuses. Paranasal sinuses are air pockets within the bones of your face. They are located beneath your eyes, in the middle of your forehead, and above your eyes. In healthy paranasal sinuses, mucus is able to drain out, and air is able to circulate through them by way of your nose. However, when your paranasal sinuses are inflamed, mucus and air can become trapped. This can allow bacteria and other germs to grow and cause infection. Sinusitis can develop quickly and last only a short time (acute) or continue over a long period (chronic). Sinusitis that lasts for more than 12 weeks is considered chronic. CAUSES Causes of sinusitis include:  Allergies.  Structural abnormalities, such as displacement of the cartilage that  separates your nostrils (deviated septum), which can decrease the air flow through your nose and sinuses and affect sinus drainage.  Functional abnormalities, such as when the small hairs (cilia) that line your sinuses and help remove mucus do not work properly or are not present. SIGNS AND SYMPTOMS Symptoms of acute and chronic sinusitis are the same. The primary symptoms are pain and pressure around the affected sinuses. Other symptoms include:  Upper toothache.  Earache.  Headache.  Bad breath.  Decreased sense of smell and taste.  A cough, which worsens when you are lying flat.  Fatigue.  Fever.  Thick drainage from your nose, which often is green and may contain pus (purulent).  Swelling and warmth over the affected sinuses. DIAGNOSIS Your health care provider will perform a physical exam. During your exam, your health care provider may perform any of the following to help determine if you have acute sinusitis or chronic sinusitis:  Look in your nose for signs of abnormal growths in your nostrils (nasal polyps).  Tap over the affected sinus to check for signs of infection.  View the inside of your sinuses using an imaging device that has a light attached (endoscope). If your health care provider suspects that you have chronic sinusitis, one or more of the following tests may be recommended:  Allergy tests.  Nasal culture. A sample of mucus is taken from your nose, sent to a lab, and screened for bacteria.  Nasal cytology. A sample of mucus is taken from your nose and examined by your health care provider to determine if your sinusitis is related  to an allergy. TREATMENT Most cases of acute sinusitis are related to a viral infection and will resolve on their own within 10 days. Sometimes, medicines are prescribed to help relieve symptoms of both acute and chronic sinusitis. These may include pain medicines, decongestants, nasal steroid sprays, or saline sprays. However,  for sinusitis related to a bacterial infection, your health care provider will prescribe antibiotic medicines. These are medicines that will help kill the bacteria causing the infection. Rarely, sinusitis is caused by a fungal infection. In these cases, your health care provider will prescribe antifungal medicine. For some cases of chronic sinusitis, surgery is needed. Generally, these are cases in which sinusitis recurs more than 3 times per year, despite other treatments. HOME CARE INSTRUCTIONS  Drink plenty of water. Water helps thin the mucus so your sinuses can drain more easily.  Use a humidifier.  Inhale steam 3-4 times a day (for example, sit in the bathroom with the shower running).  Apply a warm, moist washcloth to your face 3-4 times a day, or as directed by your health care provider.  Use saline nasal sprays to help moisten and clean your sinuses.  Take medicines only as directed by your health care provider.  If you were prescribed either an antibiotic or antifungal medicine, finish it all even if you start to feel better. SEEK IMMEDIATE MEDICAL CARE IF:  You have increasing pain or severe headaches.  You have nausea, vomiting, or drowsiness.  You have swelling around your face.  You have vision problems.  You have a stiff neck.  You have difficulty breathing.   This information is not intended to replace advice given to you by your health care provider. Make sure you discuss any questions you have with your health care provider.   Document Released: 05/27/2005 Document Revised: 06/17/2014 Document Reviewed: 06/11/2011 Elsevier Interactive Patient Education [employer]2016 Elsevier Inc.

## 2016-04-17 NOTE — Progress Notes (Signed)
Patient ID: Stefanie Holder, female    DOB: 1985-10-09, 30 y.o.   MRN: [id number]  PCP: No PCP Per Patient  Chief Complaint  Patient presents with  . Cough  . URI  . Nausea  . Dizziness    Subjective:   HPI 30 year old female presents for evaluation of upper respiratory symptoms times 2 weeks. Presents for evaluation of upper respiratory symptoms times 2 weeks. Reports a cluster of symptoms such as a hacking cough, dizziness, and "feeling feverish", headache, and, sore throat. Cough is worst during morning and at night. Her husband was recently ill and she has been around people at work that have been sick.  Social History   Social History  . Marital status: Married    Spouse name: N/A  . Number of children: N/A  . Years of education: N/A   Occupational History  . Not on file.   Social History Main Topics  . Smoking status: Light Tobacco Smoker    Packs/day: 0.20    Years: 12.00    Types: Cigarettes  . Smokeless tobacco: Never Used  . Alcohol use 1.0 oz/week    2 Standard drinks or equivalent per week  . Drug use: No  . Sexual activity: No   Other Topics Concern  . Not on file   Social History Narrative  . No narrative on file   Family History  Problem Relation Age of Onset  . Depression Mother   . Hypertension Father   . Heart disease Maternal Grandmother    Review of Systems See HPI  Patient Active Problem List   Diagnosis Date Noted  . Generalized anxiety disorder 08/21/2012  . Depression with anxiety 08/21/2012  . Insomnia 08/21/2012     Prior to Admission medications   Medication Sig Start Date End Date Taking? Authorizing Provider  clonazePAM (KLONOPIN) 1 MG tablet Take 1 tablet (1 mg total) by mouth at bedtime as needed for anxiety. Fill after 60 days 01/26/16  Yes Collie SiadStephanie D English, PA  FLUoxetine (PROZAC) 20 MG capsule Take 3 capsules (60 mg total) by mouth daily. 07/11/15  Yes Tonye Pearsonobert P Doolittle, MD  Multiple Vitamins-Minerals (MULTIVITAMIN  WITH MINERALS) tablet Take 1 tablet by mouth daily.   Yes Historical Provider, MD  Norgestimate-Ethinyl Estradiol Triphasic (ORTHO TRI-CYCLEN, 28,) 0.18/0.215/0.25 MG-35 MCG tablet Take 1 tablet by mouth daily.   Yes Historical Provider, MD  zolpidem (AMBIEN) 5 MG tablet Take 1 tablet (5 mg total) by mouth at bedtime as needed. for sleep Patient not taking: Reported on 04/17/2016 01/25/16   Wallis BambergMario Mani, PA-C     Allergies  Allergen Reactions  . Amoxapine And Related   . Penicillins        Objective:  Physical Exam  Constitutional: She is oriented to person, place, and time. She appears well-developed and well-nourished.  HENT:  Head: Normocephalic and atraumatic.  Right Ear: External ear normal.  Left Ear: External ear normal.  Nose: Mucosal edema and rhinorrhea present. Right sinus exhibits frontal sinus tenderness. Left sinus exhibits frontal sinus tenderness.  Pulmonary/Chest: She exhibits tenderness.  Musculoskeletal: Normal range of motion.  Neurological: She is alert and oriented to person, place, and time.  Skin: Skin is warm and dry.  Psychiatric: She has a normal mood and affect. Her behavior is normal. Judgment and thought content normal.     Vitals:   04/17/16 1548  BP: 122/72  Pulse: 64  Resp: 17  Temp: 98.2 F (36.8 C)   Assessment &  Plan:  1. Acute maxillary sinusitis, recurrence not specified 2. Cough 3. Other fatigue - POCT Influenza A/B  Plan: . chlorpheniramine-HYDROcodone (TUSSIONEX PENNKINETIC ER) 10-8 MG/5ML SUER    Sig: Take 5 mLs by mouth every 12 (twelve) hours as needed for cough.  . benzonatate (TESSALON) 100 MG capsule    Sig: Take 1-2 capsules (100-200 mg total) by mouth 3 (three) times daily as needed for cough.  . doxycycline (VIBRAMYCIN) 100 MG capsule    Sig: Take 1 capsule (100 mg total) by mouth 2 (two) times daily.   Return for follow-up as needed.   Godfrey PickKimberly S. Tiburcio PeaHarris, MSN, FNP-C [facility]

## 2016-05-16 DIAGNOSIS — R5383 Other fatigue: Secondary | ICD-10-CM | POA: Diagnosis not present

## 2016-05-16 DIAGNOSIS — F419 Anxiety disorder, unspecified: Secondary | ICD-10-CM | POA: Diagnosis not present

## 2016-05-16 DIAGNOSIS — F988 Other specified behavioral and emotional disorders with onset usually occurring in childhood and adolescence: Secondary | ICD-10-CM | POA: Diagnosis not present

## 2016-05-16 DIAGNOSIS — F3340 Major depressive disorder, recurrent, in remission, unspecified: Secondary | ICD-10-CM | POA: Diagnosis not present

## 2016-06-13 DIAGNOSIS — H8113 Benign paroxysmal vertigo, bilateral: Secondary | ICD-10-CM | POA: Diagnosis not present

## 2016-06-13 DIAGNOSIS — E039 Hypothyroidism, unspecified: Secondary | ICD-10-CM | POA: Diagnosis not present

## 2016-07-08 DIAGNOSIS — M542 Cervicalgia: Secondary | ICD-10-CM | POA: Diagnosis not present

## 2016-07-25 DIAGNOSIS — M542 Cervicalgia: Secondary | ICD-10-CM | POA: Diagnosis not present

## 2016-08-05 DIAGNOSIS — M542 Cervicalgia: Secondary | ICD-10-CM | POA: Diagnosis not present

## 2016-08-26 DIAGNOSIS — M542 Cervicalgia: Secondary | ICD-10-CM | POA: Diagnosis not present

## 2016-09-09 DIAGNOSIS — M542 Cervicalgia: Secondary | ICD-10-CM | POA: Diagnosis not present

## 2016-09-23 DIAGNOSIS — M542 Cervicalgia: Secondary | ICD-10-CM | POA: Diagnosis not present

## 2016-10-14 DIAGNOSIS — M542 Cervicalgia: Secondary | ICD-10-CM | POA: Diagnosis not present

## 2016-10-28 DIAGNOSIS — L818 Other specified disorders of pigmentation: Secondary | ICD-10-CM | POA: Diagnosis not present

## 2016-10-28 DIAGNOSIS — L218 Other seborrheic dermatitis: Secondary | ICD-10-CM | POA: Diagnosis not present

## 2016-10-30 DIAGNOSIS — M542 Cervicalgia: Secondary | ICD-10-CM | POA: Diagnosis not present

## 2016-10-31 DIAGNOSIS — J301 Allergic rhinitis due to pollen: Secondary | ICD-10-CM | POA: Diagnosis not present

## 2016-10-31 DIAGNOSIS — R42 Dizziness and giddiness: Secondary | ICD-10-CM | POA: Diagnosis not present

## 2016-11-11 DIAGNOSIS — M542 Cervicalgia: Secondary | ICD-10-CM | POA: Diagnosis not present

## 2016-11-12 DIAGNOSIS — R42 Dizziness and giddiness: Secondary | ICD-10-CM | POA: Diagnosis not present

## 2016-11-25 DIAGNOSIS — M542 Cervicalgia: Secondary | ICD-10-CM | POA: Diagnosis not present

## 2016-12-05 DIAGNOSIS — F419 Anxiety disorder, unspecified: Secondary | ICD-10-CM | POA: Diagnosis not present

## 2016-12-05 DIAGNOSIS — J029 Acute pharyngitis, unspecified: Secondary | ICD-10-CM | POA: Diagnosis not present

## 2016-12-05 DIAGNOSIS — R202 Paresthesia of skin: Secondary | ICD-10-CM | POA: Diagnosis not present

## 2016-12-05 DIAGNOSIS — E039 Hypothyroidism, unspecified: Secondary | ICD-10-CM | POA: Diagnosis not present

## 2016-12-05 DIAGNOSIS — M542 Cervicalgia: Secondary | ICD-10-CM | POA: Diagnosis not present

## 2016-12-16 DIAGNOSIS — M542 Cervicalgia: Secondary | ICD-10-CM | POA: Diagnosis not present

## 2016-12-23 DIAGNOSIS — M542 Cervicalgia: Secondary | ICD-10-CM | POA: Diagnosis not present

## 2016-12-24 DIAGNOSIS — H5213 Myopia, bilateral: Secondary | ICD-10-CM | POA: Diagnosis not present

## 2016-12-30 DIAGNOSIS — H5213 Myopia, bilateral: Secondary | ICD-10-CM | POA: Diagnosis not present

## 2016-12-31 DIAGNOSIS — F411 Generalized anxiety disorder: Secondary | ICD-10-CM | POA: Diagnosis not present

## 2017-01-01 DIAGNOSIS — M542 Cervicalgia: Secondary | ICD-10-CM | POA: Diagnosis not present

## 2017-01-14 DIAGNOSIS — M542 Cervicalgia: Secondary | ICD-10-CM | POA: Diagnosis not present

## 2017-01-16 DIAGNOSIS — F341 Dysthymic disorder: Secondary | ICD-10-CM | POA: Diagnosis not present

## 2017-01-16 DIAGNOSIS — F411 Generalized anxiety disorder: Secondary | ICD-10-CM | POA: Diagnosis not present

## 2017-01-30 ENCOUNTER — Encounter: Payer: Self-pay | Admitting: Neurology

## 2017-01-30 ENCOUNTER — Ambulatory Visit (INDEPENDENT_AMBULATORY_CARE_PROVIDER_SITE_OTHER): Payer: BLUE CROSS/BLUE SHIELD | Admitting: Neurology

## 2017-01-30 VITALS — BP 117/79 | HR 64 | Ht 65.0 in | Wt 141.2 lb

## 2017-01-30 DIAGNOSIS — H9202 Otalgia, left ear: Secondary | ICD-10-CM

## 2017-01-30 DIAGNOSIS — R202 Paresthesia of skin: Secondary | ICD-10-CM | POA: Insufficient documentation

## 2017-01-30 MED ORDER — DULOXETINE HCL 20 MG PO CPEP: 20.0000 mg | ORAL_CAPSULE | Freq: Every day | ORAL | 11 refills | Status: AC
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
## 2017-01-31 LAB — CK: Total CK: 73 U/L (ref 24–173)

## 2017-01-31 LAB — SEDIMENTATION RATE: SED RATE: 6 mm/h (ref 0–32)

## 2017-01-31 LAB — RPR: RPR: NONREACTIVE

## 2017-01-31 LAB — VITAMIN B12: Vitamin B-12: >2000 pg/mL — ABNORMAL HIGH (ref 232–1245)

## 2017-01-31 LAB — ANA W/REFLEX: Anti Nuclear Antibody(ANA): NEGATIVE

## 2017-01-31 LAB — VITAMIN D 25 HYDROXY (VIT D DEFICIENCY, FRACTURES): VIT D 25 HYDROXY: 65.2 ng/mL (ref 30.0–100.0)

## 2017-01-31 LAB — C-REACTIVE PROTEIN: CRP: 4.9 mg/L (ref 0.0–4.9)

## 2017-02-04 DIAGNOSIS — M542 Cervicalgia: Secondary | ICD-10-CM | POA: Diagnosis not present

## 2017-02-05 ENCOUNTER — Ambulatory Visit (INDEPENDENT_AMBULATORY_CARE_PROVIDER_SITE_OTHER): Payer: BLUE CROSS/BLUE SHIELD

## 2017-02-05 DIAGNOSIS — H9202 Otalgia, left ear: Secondary | ICD-10-CM

## 2017-02-05 DIAGNOSIS — R202 Paresthesia of skin: Secondary | ICD-10-CM

## 2017-02-11 ENCOUNTER — Encounter: Payer: Self-pay | Admitting: Neurology

## 2017-02-13 ENCOUNTER — Other Ambulatory Visit: Payer: Self-pay | Admitting: Neurology

## 2017-02-13 DIAGNOSIS — M542 Cervicalgia: Secondary | ICD-10-CM

## 2017-02-13 NOTE — Progress Notes (Signed)
Ordered MRI cervical spine.

## 2017-02-17 DIAGNOSIS — M542 Cervicalgia: Secondary | ICD-10-CM | POA: Diagnosis not present

## 2017-02-19 ENCOUNTER — Ambulatory Visit (INDEPENDENT_AMBULATORY_CARE_PROVIDER_SITE_OTHER): Payer: BLUE CROSS/BLUE SHIELD

## 2017-02-19 DIAGNOSIS — M542 Cervicalgia: Secondary | ICD-10-CM

## 2017-02-24 ENCOUNTER — Telehealth: Payer: Self-pay | Admitting: Neurology

## 2017-02-24 ENCOUNTER — Encounter: Payer: Self-pay | Admitting: Neurology

## 2017-02-24 NOTE — Telephone Encounter (Signed)
I have a question about MRI CERVICAL SPINE WO CONTRAST resulted on 02/20/17, 5:46 PM.    Ok I am having tingling still and maybe even worse inthe right side of my body. What do you think it could be from and what do I do next?!    Please call her, MRI cervical spine was normal.  I can do EMG/NCS if she wants further testing.

## 2017-02-24 NOTE — Telephone Encounter (Signed)
Spoke to patient - she is aware of her MRI results and would like to proceed w/ NCV/EMG study.  She has been scheduled on 03/19/17.

## 2017-02-25 DIAGNOSIS — M542 Cervicalgia: Secondary | ICD-10-CM | POA: Diagnosis not present

## 2017-02-28 DIAGNOSIS — Z23 Encounter for immunization: Secondary | ICD-10-CM | POA: Diagnosis not present

## 2017-03-03 DIAGNOSIS — Z3041 Encounter for surveillance of contraceptive pills: Secondary | ICD-10-CM | POA: Diagnosis not present

## 2017-03-03 DIAGNOSIS — Z1389 Encounter for screening for other disorder: Secondary | ICD-10-CM | POA: Diagnosis not present

## 2017-03-03 DIAGNOSIS — Z01419 Encounter for gynecological examination (general) (routine) without abnormal findings: Secondary | ICD-10-CM | POA: Diagnosis not present

## 2017-03-03 DIAGNOSIS — Z130 Encounter for screening for diseases of the blood and blood-forming organs and certain disorders involving the immune mechanism: Secondary | ICD-10-CM | POA: Diagnosis not present

## 2017-03-03 DIAGNOSIS — Z6824 Body mass index (BMI) 24.0-24.9, adult: Secondary | ICD-10-CM | POA: Diagnosis not present

## 2017-03-12 DIAGNOSIS — M542 Cervicalgia: Secondary | ICD-10-CM | POA: Diagnosis not present

## 2017-03-19 ENCOUNTER — Encounter (INDEPENDENT_AMBULATORY_CARE_PROVIDER_SITE_OTHER): Payer: Self-pay | Admitting: Neurology

## 2017-03-19 ENCOUNTER — Ambulatory Visit (INDEPENDENT_AMBULATORY_CARE_PROVIDER_SITE_OTHER): Payer: BLUE CROSS/BLUE SHIELD | Admitting: Neurology

## 2017-03-19 DIAGNOSIS — Z0289 Encounter for other administrative examinations: Secondary | ICD-10-CM

## 2017-03-19 DIAGNOSIS — R202 Paresthesia of skin: Secondary | ICD-10-CM | POA: Insufficient documentation

## 2017-03-19 NOTE — Progress Notes (Signed)
PATIENT: Stefanie Holder DOB: 05-Dec-1985  No chief complaint on file.    HISTORICAL  Stefanie Holder is a 31 years old right-handed female, seen in refer by her primary care doctor Charlotta Newton for evaluation of left ear pain, dizziness.   I reviewed and summarized the referring note, she has history of hypothyroidism, on supplement, depression, on polypharmacy treatment this including fluoxetine 60 mg, clonazepam 1 mg at bedtime,  She reported chronic fatigue, diffuse body achy pain, has seen different physicians in the past, is on B complex supplement. There was also reported episode of passing out, this happened around her middle school and high school years, transient loss of consciousness, usually triggered by painful stimulation, such as when she has her wisdom teeth pulled out, there was transient passing out, body jerking movement, she was seen by neurologist in the past, But was never put on any medications,  In December 2017, besides her chronic neck pain, shoulder muscle tightness achy pain, she also developed intermittent left ear pain, inside her left ear, no vertigo, no hearing loss, was seen by ENT, no abnormality found, no rash broke out, the pain was intermittent, also complains of dizziness, described as woozy unsteady sensation, it can happen any position. 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, complains of fatigue, headache at the end of the day, has been taking ibuprofen 2 tablets twice a day on a regular basis, she also complains of diffuse body achy pain, joints pain, lack of interest, Chronic insomnia, taking clonazepam along with over-the-counter melatonin or sleep aid for sleep   She also complains a year history of intermittent right arm leg paresthesia  UPDATE Mar 19 2017: I have personally reviewed MRI of the brain and cervical spine, that was normal Laboratory evaluations showed normal or negative vitamin D, ANA, C-reactive protein,  vitamin B12, negative RPR, CPK,  REVIEW OF SYSTEMS: Full 14 system review of systems performed and notable only for fatigue, blurred vision, feeling cold, joint pain, achy muscles, headaches, numbness, dizziness, seizure, sleepiness, depression, anxiety, decreased energy, disinteresting activities, racing thoughts   ALLERGIES: Allergies  Allergen Reactions  . Amoxapine And Related   . Penicillins     HOME MEDICATIONS: Current Outpatient Prescriptions  Medication Sig Dispense Refill  . b complex vitamins tablet Take 1 tablet by mouth daily.    . clonazePAM (KLONOPIN) 1 MG tablet Take 1 tablet (1 mg total) by mouth at bedtime as needed for anxiety. Fill after 60 days 30 tablet 0  . FLUoxetine (PROZAC) 20 MG capsule Take 3 capsules (60 mg total) by mouth daily. 270 capsule 3  . Loratadine (CLARITIN PO) Take by mouth daily.    . Multiple Vitamins-Minerals (MULTIVITAMIN WITH MINERALS) tablet Take 1 tablet by mouth daily.    . Multiple Vitamins-Minerals (ZINC PO) Take by mouth daily.    . Norgestimate-Ethinyl Estradiol Triphasic (ORTHO TRI-CYCLEN, 28,) 0.18/0.215/0.25 MG-35 MCG tablet Take 1 tablet by mouth daily.    . Omega-3 Fatty Acids (FISH OIL PO) Take by mouth daily.    . Thyroid (LEVOTHYROXINE-LIOTHYRONINE) 60 MG TABS Take 1 tablet by mouth daily.       PAST MEDICAL HISTORY: Past Medical History:  Diagnosis Date  . Anxiety   . Cervical strain   . Depression   . Muscle spasms of neck   . Neuromuscular disorder (HCC)    MTHFR gene controlled on methyl folate supp and vit B6 and 12  . Seizures (HCC)  pt reports "seizures from pain"    PAST SURGICAL HISTORY: Past Surgical History:  Procedure Laterality Date  . WISDOM TOOTH EXTRACTION      FAMILY HISTORY: Family History  Problem Relation Age of Onset  . Depression Mother   . Hypertension Father   . Heart disease Maternal Grandmother     SOCIAL HISTORY:  Social History   Social History  . Marital status: Married     Spouse name: N/A  . Number of children: 0  . Years of education: college   Occupational History  . Administrative work    Social History Main Topics  . Smoking status: Former Smoker    Packs/day: 0.20    Years: 12.00    Types: Cigarettes  . Smokeless tobacco: Never Used     Comment: 01/30/17 - quit three month ago  . Alcohol use Yes     Comment: glass of wine nightly  . Drug use: No  . Sexual activity: No   Other Topics Concern  . Not on file   Social History Narrative   Lives at home with husband.   Right-handed.   1-2 cups coffee per day.     PHYSICAL EXAM   There were no vitals filed for this visit.  Not recorded      There is no height or weight on file to calculate BMI.  PHYSICAL EXAMNIATION:  Gen: NAD, conversant, well nourised, obese, well groomed                     Cardiovascular: Regular rate rhythm, no peripheral edema, warm, nontender. Eyes: Conjunctivae clear without exudates or hemorrhage Neck: Supple, no carotid bruits. Pulmonary: Clear to auscultation bilaterally   NEUROLOGICAL EXAM:  MENTAL STATUS: Speech:    Speech is normal; fluent and spontaneous with normal comprehension.  Cognition:     Orientation to time, place and person     Normal recent and remote memory     Normal Attention span and concentration     Normal Language, naming, repeating,spontaneous speech     Fund of knowledge   CRANIAL NERVES: CN II: Visual fields are full to confrontation. Fundoscopic exam is normal with sharp discs and no vascular changes. Pupils are round equal and briskly reactive to light. CN III, IV, VI: extraocular movement are normal. No ptosis. CN V: Facial sensation is intact to pinprick in all 3 divisions bilaterally. Corneal responses are intact.  CN VII: Face is symmetric with normal eye closure and smile. CN VIII: Hearing is normal to rubbing fingers CN IX, X: Palate elevates symmetrically. Phonation is normal. CN XI: Head turning and  shoulder shrug are intact CN XII: Tongue is midline with normal movements and no atrophy.  MOTOR: There is no pronator drift of out-stretched arms. Muscle bulk and tone are normal. Muscle strength is normal.  REFLEXES: Reflexes are 2+ and symmetric at the biceps, triceps, knees, and ankles. Plantar responses are flexor.  SENSORY: Intact to light touch, pinprick, positional sensation and vibratory sensation are intact in fingers and toes.  COORDINATION: Rapid alternating movements and fine finger movements are intact. There is no dysmetria on finger-to-nose and heel-knee-shin.    GAIT/STANCE: Posture is normal. Gait is steady with normal steps, base, arm swing, and turning. Heel and toe walking are normal. Tandem gait is normal.  Romberg is absent.   DIAGNOSTIC DATA (LABS, IMAGING, TESTING) - I reviewed patient records, labs, notes, testing and imaging myself where available.   ASSESSMENT AND PLAN  Stefanie Holder is a 31 y.o. female   Right sided paresthesia, diffuse body achy pain, chronic fatigue  MRI of the brain to rule out central nervous system etiology  Laboratory evaluation for inflammatory markers,   Add on Cymbalta 20 mg daily    Levert Feinstein, M.D. Ph.D.  [facility] [address], [address] [place], [place] [address] Ph: [phone] Fax: [phone]  CC: Tally Joe, MD

## 2017-03-19 NOTE — Procedures (Signed)
Full Name: Stefanie Holder Gender: Female MRN #: [id number] Date of Birth: 19-Sep-1985    Visit Date: 03/19/2017 08:09 Age: 31 Years 6 Months Old Examining Physician: Naomie Dean, MD  Referring Physician: Terrace Arabia, MD History: 31 year old female presented with right arm and leg paresthesia  Summary of the test: Nerve conduction study: Right upper and lower extremity sensory and motor conduction studies were normal.  Electromyography: Selected right upper and lower extremity muscles, right lumbar and cervical paraspinal muscles were normal.  Conclusion:  This is a normal study, there is no electrodiagnostic evidence of right cervical, right lumbosacral radiculopathy. There is no evidence of right upper or lower extremity neuropathy.   ------------------------------- [name], M.D.  [facility] [address] [place], [place] [address] Tel: [phone] Fax: [phone]        [facility]    Nerve / Sites Muscle Latency Ref. Amplitude Ref. Rel Amp Segments Distance Velocity Ref. Area    ms ms mV mV %  cm m/s m/s mVms  R Median - APB     Wrist APB 3.8 ?4.4 4.4 ?4.0 100 Wrist - APB 7   20.5     Upper arm APB 7.4  4.4  97.9 Upper arm - Wrist 19 53 ?49 19.2  R Ulnar - ADM     Wrist ADM 2.4 ?3.3 10.3 ?6.0 100 Wrist - ADM 7   28.6     B.Elbow ADM 5.3  9.6  93.7 B.Elbow - Wrist 18 64 ?49 28.5     A.Elbow ADM 7.1  9.6  99.7 A.Elbow - B.Elbow 12 64 ?49 28.5         A.Elbow - Wrist      R Peroneal - EDB     Ankle EDB 4.2 ?6.5 8.1 ?2.0 100 Ankle - EDB 9   26.0     Fib head EDB 9.8  7.4  91.2 Fib head - Ankle 30 53 ?44 24.5     Pop fossa EDB 12.1  6.6  89.3 Pop fossa - Fib head 12 54 ?44 22.7         Pop fossa - Ankle      R Tibial - AH     Ankle AH 4.6 ?5.8 13.6 ?4.0 100 Ankle - AH 9   30.4     Pop fossa AH 11.7  8.9  65.7 Pop fossa - Ankle 37 52 ?41 24.0             SNC    Nerve / Sites Rec. Site Peak Lat Ref.  Amp Ref. Segments Distance Peak Diff Ref.    ms ms V  V  cm ms ms  R Radial - Anatomical snuff box (Forearm)     Forearm Wrist 2.4 ?2.9 29 ?15 Forearm - Wrist 10    R Sural - Ankle (Calf)     Calf Ankle 3.2 ?4.4 12 ?6 Calf - Ankle 14    R Superficial peroneal - Ankle     Lat leg Ankle 3.9 ?4.4 9 ?6 Lat leg - Ankle 14    R Median, Ulnar - Transcarpal comparison     Median Palm Wrist 2.1 ?2.2 42 ?35 Median Palm - Wrist 8       Ulnar Palm Wrist 2.0 ?2.2 48 ?12 Ulnar Palm - Wrist 8          Median Palm - Ulnar Palm  0.2 ?0.4  R Median - Orthodromic (Dig II, Mid palm)  Dig II Wrist 3.2 ?3.4 17 ?10 Dig II - Wrist 13    R Ulnar - Orthodromic, (Dig V, Mid palm)     Dig V Wrist 2.6 ?3.1 16 ?5 Dig V - Wrist 70                   F  Wave    Nerve F Lat Ref.   ms ms  R Tibial - AH 45.5 ?56.0  R Ulnar - ADM 25.2 ?32.0         EMG full       EMG Summary Table    Spontaneous MUAP Recruitment  Muscle IA Fib PSW Fasc Other Amp Dur. Poly Pattern  R. Pronator teres Normal None None None _______ Normal Normal Normal Normal  R. Biceps brachii Normal None None None _______ Normal Normal Normal Normal  R. Deltoid Normal None None None _______ Normal Normal Normal Normal  R. Triceps brachii Normal None None None _______ Normal Normal Normal Normal  R. Cervical paraspinals Normal None None None _______ Normal Normal Normal Normal  R. Tibialis anterior Normal None None None _______ Normal Normal Normal Normal  R. Tibialis posterior Normal None None None _______ Normal Normal Normal Normal  R. Peroneus longus Normal None None None _______ Normal Normal Normal Normal  R. Vastus lateralis Normal None None None _______ Normal Normal Normal Normal  R. Lumbar paraspinals (mid) Normal None None None _______ Normal Normal Normal Normal  R. Lumbar paraspinals (low) Normal None None None _______ Normal Normal Normal Normal

## 2017-03-24 DIAGNOSIS — M542 Cervicalgia: Secondary | ICD-10-CM | POA: Diagnosis not present

## 2017-04-07 DIAGNOSIS — M542 Cervicalgia: Secondary | ICD-10-CM | POA: Diagnosis not present

## 2017-05-08 ENCOUNTER — Ambulatory Visit: Payer: BLUE CROSS/BLUE SHIELD | Admitting: Neurology

## 2017-05-13 DIAGNOSIS — M542 Cervicalgia: Secondary | ICD-10-CM | POA: Diagnosis not present

## 2017-06-11 DIAGNOSIS — M542 Cervicalgia: Secondary | ICD-10-CM | POA: Diagnosis not present

## 2017-07-14 DIAGNOSIS — M542 Cervicalgia: Secondary | ICD-10-CM | POA: Diagnosis not present

## 2017-07-28 DIAGNOSIS — M542 Cervicalgia: Secondary | ICD-10-CM | POA: Diagnosis not present

## 2017-08-05 DIAGNOSIS — M542 Cervicalgia: Secondary | ICD-10-CM | POA: Diagnosis not present

## 2017-08-05 DIAGNOSIS — E039 Hypothyroidism, unspecified: Secondary | ICD-10-CM | POA: Diagnosis not present

## 2017-08-05 DIAGNOSIS — F419 Anxiety disorder, unspecified: Secondary | ICD-10-CM | POA: Diagnosis not present

## 2017-08-07 DIAGNOSIS — H5213 Myopia, bilateral: Secondary | ICD-10-CM | POA: Diagnosis not present

## 2017-08-11 DIAGNOSIS — M542 Cervicalgia: Secondary | ICD-10-CM | POA: Diagnosis not present

## 2017-08-25 DIAGNOSIS — M542 Cervicalgia: Secondary | ICD-10-CM | POA: Diagnosis not present

## 2017-09-08 DIAGNOSIS — M542 Cervicalgia: Secondary | ICD-10-CM | POA: Diagnosis not present

## 2017-09-22 DIAGNOSIS — M542 Cervicalgia: Secondary | ICD-10-CM | POA: Diagnosis not present

## 2017-10-07 DIAGNOSIS — M542 Cervicalgia: Secondary | ICD-10-CM | POA: Diagnosis not present

## 2017-10-22 DIAGNOSIS — M542 Cervicalgia: Secondary | ICD-10-CM | POA: Diagnosis not present

## 2017-10-31 DIAGNOSIS — R6889 Other general symptoms and signs: Secondary | ICD-10-CM | POA: Diagnosis not present

## 2017-10-31 DIAGNOSIS — J111 Influenza due to unidentified influenza virus with other respiratory manifestations: Secondary | ICD-10-CM | POA: Diagnosis not present

## 2017-11-10 DIAGNOSIS — M542 Cervicalgia: Secondary | ICD-10-CM | POA: Diagnosis not present

## 2017-11-26 DIAGNOSIS — M542 Cervicalgia: Secondary | ICD-10-CM | POA: Diagnosis not present

## 2017-12-16 DIAGNOSIS — M542 Cervicalgia: Secondary | ICD-10-CM | POA: Diagnosis not present

## 2018-01-12 DIAGNOSIS — M542 Cervicalgia: Secondary | ICD-10-CM | POA: Diagnosis not present

## 2018-01-26 DIAGNOSIS — M542 Cervicalgia: Secondary | ICD-10-CM | POA: Diagnosis not present

## 2018-02-02 DIAGNOSIS — M542 Cervicalgia: Secondary | ICD-10-CM | POA: Diagnosis not present

## 2018-02-02 DIAGNOSIS — R5383 Other fatigue: Secondary | ICD-10-CM | POA: Diagnosis not present

## 2018-02-02 DIAGNOSIS — Z0000 Encounter for general adult medical examination without abnormal findings: Secondary | ICD-10-CM | POA: Diagnosis not present

## 2018-02-02 DIAGNOSIS — F419 Anxiety disorder, unspecified: Secondary | ICD-10-CM | POA: Diagnosis not present

## 2018-02-02 DIAGNOSIS — E039 Hypothyroidism, unspecified: Secondary | ICD-10-CM | POA: Diagnosis not present

## 2018-02-08 IMAGING — DX DG ABDOMEN 1V
1 series · 1 of 1 positions shown · non-contrast
Comparison: None.

CLINICAL DATA: Lower abdominal pain for 5 days.

EXAM:
ABDOMEN - 1 VIEW

[abdomen kub]
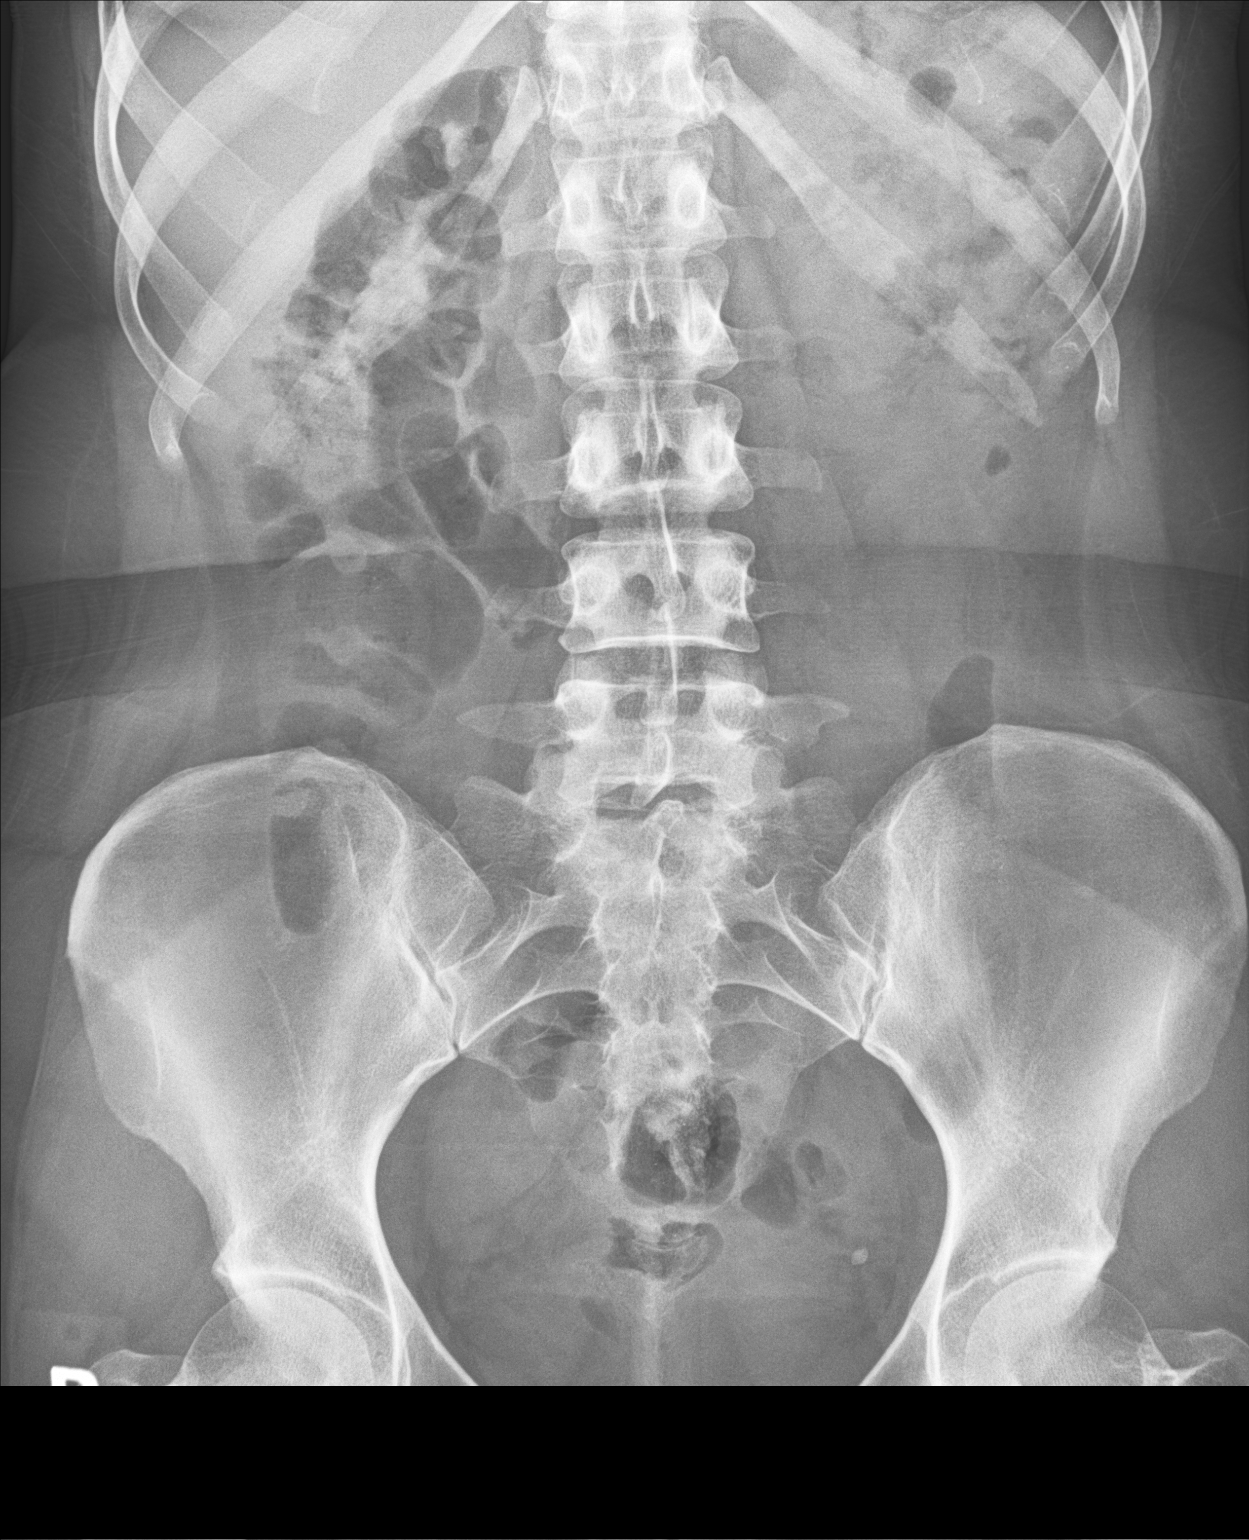

[1 of 1 positions shown; findings below may reference images not displayed]

FINDINGS: Small calcified phlebolith in the left lower pelvis. There is a non
obstructive bowel gas pattern. No supine evidence of free air. No
organomegaly or suspicious calcification.No acute bony abnormality.
IMPRESSION: Negative.

## 2018-02-10 DIAGNOSIS — M542 Cervicalgia: Secondary | ICD-10-CM | POA: Diagnosis not present

## 2018-02-11 DIAGNOSIS — R1030 Lower abdominal pain, unspecified: Secondary | ICD-10-CM | POA: Diagnosis not present

## 2018-02-20 DIAGNOSIS — Z01419 Encounter for gynecological examination (general) (routine) without abnormal findings: Secondary | ICD-10-CM | POA: Diagnosis not present

## 2018-02-20 DIAGNOSIS — Z1389 Encounter for screening for other disorder: Secondary | ICD-10-CM | POA: Diagnosis not present

## 2018-02-20 DIAGNOSIS — Z130 Encounter for screening for diseases of the blood and blood-forming organs and certain disorders involving the immune mechanism: Secondary | ICD-10-CM | POA: Diagnosis not present

## 2018-02-20 DIAGNOSIS — Z6824 Body mass index (BMI) 24.0-24.9, adult: Secondary | ICD-10-CM | POA: Diagnosis not present

## 2018-02-20 DIAGNOSIS — R1031 Right lower quadrant pain: Secondary | ICD-10-CM | POA: Diagnosis not present

## 2018-02-23 DIAGNOSIS — M542 Cervicalgia: Secondary | ICD-10-CM | POA: Diagnosis not present

## 2018-02-26 DIAGNOSIS — R1031 Right lower quadrant pain: Secondary | ICD-10-CM | POA: Diagnosis not present

## 2018-02-26 DIAGNOSIS — Z30430 Encounter for insertion of intrauterine contraceptive device: Secondary | ICD-10-CM | POA: Diagnosis not present

## 2018-02-26 DIAGNOSIS — Z3041 Encounter for surveillance of contraceptive pills: Secondary | ICD-10-CM | POA: Diagnosis not present

## 2018-03-06 ENCOUNTER — Other Ambulatory Visit: Payer: Self-pay | Admitting: Physician Assistant

## 2018-03-06 DIAGNOSIS — R1011 Right upper quadrant pain: Secondary | ICD-10-CM | POA: Diagnosis not present

## 2018-03-06 DIAGNOSIS — R1013 Epigastric pain: Secondary | ICD-10-CM | POA: Diagnosis not present

## 2018-03-06 DIAGNOSIS — K30 Functional dyspepsia: Secondary | ICD-10-CM | POA: Diagnosis not present

## 2018-03-10 ENCOUNTER — Ambulatory Visit
Admission: RE | Admit: 2018-03-10 | Discharge: 2018-03-10 | Disposition: A | Discharge status: Home / Self Care | Payer: BLUE CROSS/BLUE SHIELD | Source: Ambulatory Visit | Attending: Physician Assistant | Admitting: Physician Assistant

## 2018-03-10 DIAGNOSIS — R1013 Epigastric pain: Secondary | ICD-10-CM

## 2018-03-10 DIAGNOSIS — R1011 Right upper quadrant pain: Secondary | ICD-10-CM | POA: Diagnosis not present

## 2018-03-12 DIAGNOSIS — K2930 Chronic superficial gastritis without bleeding: Secondary | ICD-10-CM | POA: Diagnosis not present

## 2018-03-12 DIAGNOSIS — K210 Gastro-esophageal reflux disease with esophagitis: Secondary | ICD-10-CM | POA: Diagnosis not present

## 2018-03-12 DIAGNOSIS — R1013 Epigastric pain: Secondary | ICD-10-CM | POA: Diagnosis not present

## 2018-03-16 ENCOUNTER — Other Ambulatory Visit: Payer: BLUE CROSS/BLUE SHIELD

## 2018-03-16 DIAGNOSIS — K210 Gastro-esophageal reflux disease with esophagitis: Secondary | ICD-10-CM | POA: Diagnosis not present

## 2018-03-16 DIAGNOSIS — R1013 Epigastric pain: Secondary | ICD-10-CM | POA: Diagnosis not present

## 2018-03-16 DIAGNOSIS — K2930 Chronic superficial gastritis without bleeding: Secondary | ICD-10-CM | POA: Diagnosis not present

## 2018-03-18 ENCOUNTER — Other Ambulatory Visit (HOSPITAL_COMMUNITY): Payer: Self-pay | Admitting: Physician Assistant

## 2018-03-18 DIAGNOSIS — R1011 Right upper quadrant pain: Secondary | ICD-10-CM

## 2018-03-19 DIAGNOSIS — K2930 Chronic superficial gastritis without bleeding: Secondary | ICD-10-CM | POA: Diagnosis not present

## 2018-03-19 DIAGNOSIS — K210 Gastro-esophageal reflux disease with esophagitis: Secondary | ICD-10-CM | POA: Diagnosis not present

## 2018-03-23 ENCOUNTER — Encounter (HOSPITAL_COMMUNITY)
Admission: RE | Admit: 2018-03-23 | Discharge: 2018-03-23 | Disposition: A | Discharge status: Home / Self Care | Payer: BLUE CROSS/BLUE SHIELD | Source: Ambulatory Visit | Attending: Physician Assistant | Admitting: Physician Assistant

## 2018-03-23 DIAGNOSIS — R1011 Right upper quadrant pain: Secondary | ICD-10-CM | POA: Diagnosis not present

## 2018-03-23 MED ORDER — TECHNETIUM TC 99M MEBROFENIN IV KIT
5.4000 | PACK | Freq: Once | INTRAVENOUS | Status: AC | PRN
  Administered 2018-03-23: 5.4 via INTRAVENOUS

## 2018-03-25 DIAGNOSIS — M542 Cervicalgia: Secondary | ICD-10-CM | POA: Diagnosis not present

## 2018-03-26 DIAGNOSIS — R1013 Epigastric pain: Secondary | ICD-10-CM | POA: Diagnosis not present

## 2018-04-09 DIAGNOSIS — M542 Cervicalgia: Secondary | ICD-10-CM | POA: Diagnosis not present

## 2018-04-22 DIAGNOSIS — M542 Cervicalgia: Secondary | ICD-10-CM | POA: Diagnosis not present

## 2018-05-05 DIAGNOSIS — M542 Cervicalgia: Secondary | ICD-10-CM | POA: Diagnosis not present

## 2018-05-18 DIAGNOSIS — M542 Cervicalgia: Secondary | ICD-10-CM | POA: Diagnosis not present

## 2018-06-15 DIAGNOSIS — M542 Cervicalgia: Secondary | ICD-10-CM | POA: Diagnosis not present

## 2018-06-29 DIAGNOSIS — M542 Cervicalgia: Secondary | ICD-10-CM | POA: Diagnosis not present

## 2018-07-13 DIAGNOSIS — M542 Cervicalgia: Secondary | ICD-10-CM | POA: Diagnosis not present

## 2018-07-27 DIAGNOSIS — M542 Cervicalgia: Secondary | ICD-10-CM | POA: Diagnosis not present

## 2018-09-10 DIAGNOSIS — F3340 Major depressive disorder, recurrent, in remission, unspecified: Secondary | ICD-10-CM | POA: Diagnosis not present

## 2018-09-10 DIAGNOSIS — E039 Hypothyroidism, unspecified: Secondary | ICD-10-CM | POA: Diagnosis not present

## 2018-09-10 DIAGNOSIS — F419 Anxiety disorder, unspecified: Secondary | ICD-10-CM | POA: Diagnosis not present

## 2018-10-28 DIAGNOSIS — M542 Cervicalgia: Secondary | ICD-10-CM | POA: Diagnosis not present

## 2018-11-18 DIAGNOSIS — M542 Cervicalgia: Secondary | ICD-10-CM | POA: Diagnosis not present

## 2018-12-02 DIAGNOSIS — M542 Cervicalgia: Secondary | ICD-10-CM | POA: Diagnosis not present

## 2018-12-25 DIAGNOSIS — Z20828 Contact with and (suspected) exposure to other viral communicable diseases: Secondary | ICD-10-CM | POA: Diagnosis not present

## 2019-01-21 DIAGNOSIS — M542 Cervicalgia: Secondary | ICD-10-CM | POA: Diagnosis not present

## 2019-02-09 DIAGNOSIS — M542 Cervicalgia: Secondary | ICD-10-CM | POA: Diagnosis not present

## 2019-02-26 DIAGNOSIS — M542 Cervicalgia: Secondary | ICD-10-CM | POA: Diagnosis not present

## 2019-03-10 DIAGNOSIS — Z124 Encounter for screening for malignant neoplasm of cervix: Secondary | ICD-10-CM | POA: Diagnosis not present

## 2019-03-10 DIAGNOSIS — Z01419 Encounter for gynecological examination (general) (routine) without abnormal findings: Secondary | ICD-10-CM | POA: Diagnosis not present

## 2019-03-10 DIAGNOSIS — Z130 Encounter for screening for diseases of the blood and blood-forming organs and certain disorders involving the immune mechanism: Secondary | ICD-10-CM | POA: Diagnosis not present

## 2019-03-10 DIAGNOSIS — Z1151 Encounter for screening for human papillomavirus (HPV): Secondary | ICD-10-CM | POA: Diagnosis not present

## 2019-03-11 DIAGNOSIS — M542 Cervicalgia: Secondary | ICD-10-CM | POA: Diagnosis not present

## 2019-03-12 DIAGNOSIS — F3340 Major depressive disorder, recurrent, in remission, unspecified: Secondary | ICD-10-CM | POA: Diagnosis not present

## 2019-03-12 DIAGNOSIS — F419 Anxiety disorder, unspecified: Secondary | ICD-10-CM | POA: Diagnosis not present

## 2019-03-12 DIAGNOSIS — E039 Hypothyroidism, unspecified: Secondary | ICD-10-CM | POA: Diagnosis not present

## 2019-03-18 DIAGNOSIS — E039 Hypothyroidism, unspecified: Secondary | ICD-10-CM | POA: Diagnosis not present

## 2019-03-25 DIAGNOSIS — M542 Cervicalgia: Secondary | ICD-10-CM | POA: Diagnosis not present

## 2019-03-31 DIAGNOSIS — R198 Other specified symptoms and signs involving the digestive system and abdomen: Secondary | ICD-10-CM | POA: Diagnosis not present

## 2019-03-31 DIAGNOSIS — K219 Gastro-esophageal reflux disease without esophagitis: Secondary | ICD-10-CM | POA: Diagnosis not present

## 2019-04-01 DIAGNOSIS — Z1159 Encounter for screening for other viral diseases: Secondary | ICD-10-CM | POA: Diagnosis not present

## 2019-04-08 DIAGNOSIS — M542 Cervicalgia: Secondary | ICD-10-CM | POA: Diagnosis not present

## 2019-04-13 ENCOUNTER — Other Ambulatory Visit: Payer: Self-pay

## 2019-04-13 DIAGNOSIS — Z20822 Contact with and (suspected) exposure to covid-19: Secondary | ICD-10-CM

## 2019-04-15 LAB — NOVEL CORONAVIRUS, NAA: SARS-CoV-2, NAA: NOT DETECTED

## 2019-04-23 DIAGNOSIS — Z03818 Encounter for observation for suspected exposure to other biological agents ruled out: Secondary | ICD-10-CM | POA: Diagnosis not present

## 2019-04-23 DIAGNOSIS — Z20828 Contact with and (suspected) exposure to other viral communicable diseases: Secondary | ICD-10-CM | POA: Diagnosis not present

## 2019-05-05 DIAGNOSIS — Z20818 Contact with and (suspected) exposure to other bacterial communicable diseases: Secondary | ICD-10-CM | POA: Diagnosis not present

## 2019-05-13 DIAGNOSIS — M542 Cervicalgia: Secondary | ICD-10-CM | POA: Diagnosis not present

## 2019-05-28 DIAGNOSIS — Z1159 Encounter for screening for other viral diseases: Secondary | ICD-10-CM | POA: Diagnosis not present

## 2019-06-15 DIAGNOSIS — M542 Cervicalgia: Secondary | ICD-10-CM | POA: Diagnosis not present

## 2019-06-20 DIAGNOSIS — Z20828 Contact with and (suspected) exposure to other viral communicable diseases: Secondary | ICD-10-CM | POA: Diagnosis not present

## 2019-07-01 DIAGNOSIS — M542 Cervicalgia: Secondary | ICD-10-CM | POA: Diagnosis not present

## 2019-07-22 DIAGNOSIS — Z20828 Contact with and (suspected) exposure to other viral communicable diseases: Secondary | ICD-10-CM | POA: Diagnosis not present

## 2019-07-23 DIAGNOSIS — M542 Cervicalgia: Secondary | ICD-10-CM | POA: Diagnosis not present

## 2019-08-03 DIAGNOSIS — M542 Cervicalgia: Secondary | ICD-10-CM | POA: Diagnosis not present

## 2019-08-11 DIAGNOSIS — Z23 Encounter for immunization: Secondary | ICD-10-CM | POA: Diagnosis not present

## 2019-08-27 DIAGNOSIS — E039 Hypothyroidism, unspecified: Secondary | ICD-10-CM | POA: Diagnosis not present

## 2019-08-27 DIAGNOSIS — M542 Cervicalgia: Secondary | ICD-10-CM | POA: Diagnosis not present

## 2019-08-27 DIAGNOSIS — F419 Anxiety disorder, unspecified: Secondary | ICD-10-CM | POA: Diagnosis not present

## 2019-09-01 DIAGNOSIS — M542 Cervicalgia: Secondary | ICD-10-CM | POA: Diagnosis not present

## 2019-09-08 DIAGNOSIS — Z23 Encounter for immunization: Secondary | ICD-10-CM | POA: Diagnosis not present

## 2019-09-15 DIAGNOSIS — M542 Cervicalgia: Secondary | ICD-10-CM | POA: Diagnosis not present

## 2019-09-29 DIAGNOSIS — M542 Cervicalgia: Secondary | ICD-10-CM | POA: Diagnosis not present

## 2019-10-13 DIAGNOSIS — M542 Cervicalgia: Secondary | ICD-10-CM | POA: Diagnosis not present

## 2019-11-11 DIAGNOSIS — J039 Acute tonsillitis, unspecified: Secondary | ICD-10-CM | POA: Diagnosis not present

## 2019-11-11 DIAGNOSIS — J029 Acute pharyngitis, unspecified: Secondary | ICD-10-CM | POA: Diagnosis not present

## 2019-11-18 DIAGNOSIS — E039 Hypothyroidism, unspecified: Secondary | ICD-10-CM | POA: Diagnosis not present

## 2019-12-03 DIAGNOSIS — M542 Cervicalgia: Secondary | ICD-10-CM | POA: Diagnosis not present

## 2019-12-16 DIAGNOSIS — I889 Nonspecific lymphadenitis, unspecified: Secondary | ICD-10-CM | POA: Diagnosis not present

## 2019-12-16 DIAGNOSIS — M542 Cervicalgia: Secondary | ICD-10-CM | POA: Diagnosis not present

## 2019-12-22 DIAGNOSIS — M542 Cervicalgia: Secondary | ICD-10-CM | POA: Diagnosis not present

## 2020-01-05 DIAGNOSIS — M542 Cervicalgia: Secondary | ICD-10-CM | POA: Diagnosis not present

## 2020-01-13 DIAGNOSIS — Z03818 Encounter for observation for suspected exposure to other biological agents ruled out: Secondary | ICD-10-CM | POA: Diagnosis not present

## 2020-01-13 DIAGNOSIS — Z20822 Contact with and (suspected) exposure to covid-19: Secondary | ICD-10-CM | POA: Diagnosis not present

## 2020-01-26 DIAGNOSIS — M542 Cervicalgia: Secondary | ICD-10-CM | POA: Diagnosis not present

## 2020-01-30 IMAGING — NM NM HEPATO W/GB/PHARM/[PERSON_NAME]
1 series · 12 of 12 positions shown · non-contrast
Comparison: Abdominal ultrasound March 10, 2018

CLINICAL DATA: Right upper quadrant abdominal pain and nausea with
intolerance to fatty foods for the past 6 weeks.

EXAM:
NUCLEAR MEDICINE HEPATOBILIARY IMAGING WITH GALLBLADDER EF
TECHNIQUE: Sequential images of the abdomen were obtained [DATE] minutes
following intravenous administration of radiopharmaceutical. After
oral ingestion of Ensure, gallbladder ejection fraction was
determined. At 60 min, normal ejection fraction is greater than 33%.
RADIOPHARMACEUTICALS:  5.4 mCi Qc-SSm  Choletec IV

[Series 1: hepato · 4.46mm/px · 2 acquisitions, 12 frames shown]
[im 1/2]
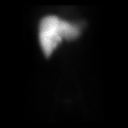
[im 1/2]
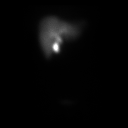
[im 1/2]
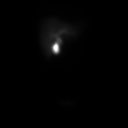
[im 1/2]
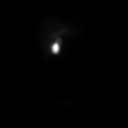
[im 1/2]
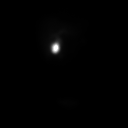
[im 1/2]
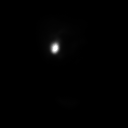
[im 2/2]
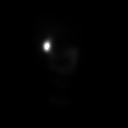
[im 2/2]
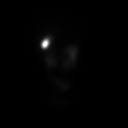
[im 2/2]
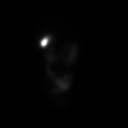
[im 2/2]
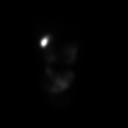
[im 2/2]
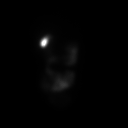
[im 2/2]
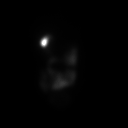

[12 of 12 positions shown; findings below may reference images not displayed]

FINDINGS: The patient had abdominal discomfort prior to and during the
procedure which did not appear to be related to Ensure
administration. There is adequate uptake of the radiopharmaceutical
by the liver. There is visualization of the common bile duct and
gallbladder by 15 minutes.

Calculated gallbladder ejection fraction is 45%%. (Normal
gallbladder ejection fraction with Ensure is greater than 33%.)
IMPRESSION: Normal hepatobiliary scan with normal gallbladder ejection fraction.
No evidence of cystic duct or common bile duct obstruction.

## 2020-02-16 DIAGNOSIS — M542 Cervicalgia: Secondary | ICD-10-CM | POA: Diagnosis not present

## 2020-03-07 DIAGNOSIS — Z20828 Contact with and (suspected) exposure to other viral communicable diseases: Secondary | ICD-10-CM | POA: Diagnosis not present

## 2020-03-08 DIAGNOSIS — M542 Cervicalgia: Secondary | ICD-10-CM | POA: Diagnosis not present

## 2020-03-14 DIAGNOSIS — Z130 Encounter for screening for diseases of the blood and blood-forming organs and certain disorders involving the immune mechanism: Secondary | ICD-10-CM | POA: Diagnosis not present

## 2020-03-14 DIAGNOSIS — Z01419 Encounter for gynecological examination (general) (routine) without abnormal findings: Secondary | ICD-10-CM | POA: Diagnosis not present

## 2020-03-14 DIAGNOSIS — Z1389 Encounter for screening for other disorder: Secondary | ICD-10-CM | POA: Diagnosis not present

## 2020-03-14 DIAGNOSIS — Z6823 Body mass index (BMI) 23.0-23.9, adult: Secondary | ICD-10-CM | POA: Diagnosis not present

## 2020-03-28 DIAGNOSIS — M542 Cervicalgia: Secondary | ICD-10-CM | POA: Diagnosis not present

## 2020-04-19 DIAGNOSIS — M542 Cervicalgia: Secondary | ICD-10-CM | POA: Diagnosis not present

## 2020-04-25 DIAGNOSIS — E039 Hypothyroidism, unspecified: Secondary | ICD-10-CM | POA: Diagnosis not present

## 2020-04-25 DIAGNOSIS — F419 Anxiety disorder, unspecified: Secondary | ICD-10-CM | POA: Diagnosis not present

## 2020-04-25 DIAGNOSIS — M542 Cervicalgia: Secondary | ICD-10-CM | POA: Diagnosis not present

## 2020-04-26 DIAGNOSIS — Z20822 Contact with and (suspected) exposure to covid-19: Secondary | ICD-10-CM | POA: Diagnosis not present

## 2020-05-08 DIAGNOSIS — E039 Hypothyroidism, unspecified: Secondary | ICD-10-CM | POA: Diagnosis not present

## 2020-05-08 DIAGNOSIS — J069 Acute upper respiratory infection, unspecified: Secondary | ICD-10-CM | POA: Diagnosis not present

## 2020-05-08 DIAGNOSIS — R55 Syncope and collapse: Secondary | ICD-10-CM | POA: Diagnosis not present

## 2020-05-08 DIAGNOSIS — R238 Other skin changes: Secondary | ICD-10-CM | POA: Diagnosis not present

## 2020-05-12 DIAGNOSIS — Z20822 Contact with and (suspected) exposure to covid-19: Secondary | ICD-10-CM | POA: Diagnosis not present

## 2020-05-18 DIAGNOSIS — Z20822 Contact with and (suspected) exposure to covid-19: Secondary | ICD-10-CM | POA: Diagnosis not present

## 2020-05-21 DIAGNOSIS — Z20822 Contact with and (suspected) exposure to covid-19: Secondary | ICD-10-CM | POA: Diagnosis not present

## 2020-05-25 DIAGNOSIS — M542 Cervicalgia: Secondary | ICD-10-CM | POA: Diagnosis not present

## 2020-06-06 DIAGNOSIS — R238 Other skin changes: Secondary | ICD-10-CM | POA: Diagnosis not present

## 2020-06-06 DIAGNOSIS — E039 Hypothyroidism, unspecified: Secondary | ICD-10-CM | POA: Diagnosis not present

## 2020-06-06 DIAGNOSIS — R55 Syncope and collapse: Secondary | ICD-10-CM | POA: Diagnosis not present

## 2020-06-16 DIAGNOSIS — M542 Cervicalgia: Secondary | ICD-10-CM | POA: Diagnosis not present

## 2020-06-22 DIAGNOSIS — Z79891 Long term (current) use of opiate analgesic: Secondary | ICD-10-CM | POA: Diagnosis not present

## 2020-06-22 DIAGNOSIS — F411 Generalized anxiety disorder: Secondary | ICD-10-CM | POA: Diagnosis not present

## 2020-06-22 DIAGNOSIS — F330 Major depressive disorder, recurrent, mild: Secondary | ICD-10-CM | POA: Diagnosis not present

## 2020-06-30 DIAGNOSIS — M542 Cervicalgia: Secondary | ICD-10-CM | POA: Diagnosis not present

## 2020-07-04 ENCOUNTER — Institutional Professional Consult (permissible substitution): Payer: BC Managed Care – PPO | Admitting: Neurology

## 2020-07-05 DIAGNOSIS — F411 Generalized anxiety disorder: Secondary | ICD-10-CM | POA: Diagnosis not present

## 2020-07-05 DIAGNOSIS — F330 Major depressive disorder, recurrent, mild: Secondary | ICD-10-CM | POA: Diagnosis not present

## 2020-07-19 DIAGNOSIS — M542 Cervicalgia: Secondary | ICD-10-CM | POA: Diagnosis not present

## 2020-08-04 DIAGNOSIS — M542 Cervicalgia: Secondary | ICD-10-CM | POA: Diagnosis not present

## 2020-08-04 DIAGNOSIS — Z20822 Contact with and (suspected) exposure to covid-19: Secondary | ICD-10-CM | POA: Diagnosis not present

## 2020-08-09 DIAGNOSIS — N6032 Fibrosclerosis of left breast: Secondary | ICD-10-CM | POA: Diagnosis not present

## 2020-08-09 DIAGNOSIS — N6001 Solitary cyst of right breast: Secondary | ICD-10-CM | POA: Diagnosis not present

## 2020-08-09 IMAGING — US US ABDOMEN LIMITED
1 series · 14 of 25 positions shown · non-contrast
Comparison: None.

CLINICAL DATA: Right upper quadrant pain

EXAM:
ULTRASOUND ABDOMEN LIMITED RIGHT UPPER QUADRANT

[Series 1: us abdomen limited · 0.15mm/px · 14 of 41 slices shown]
[im 1/41]
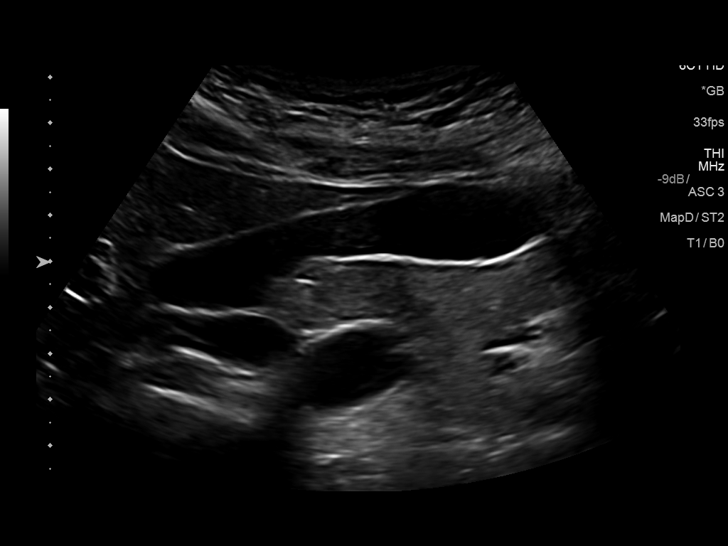
[im 4/41]
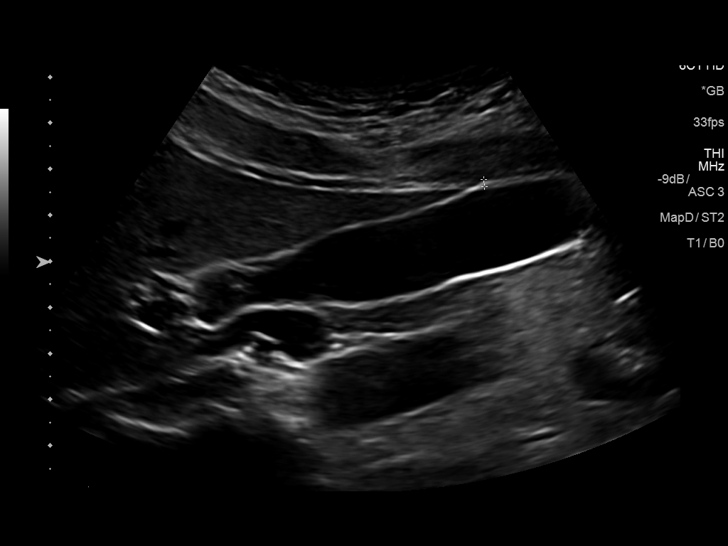
[im 7/41]
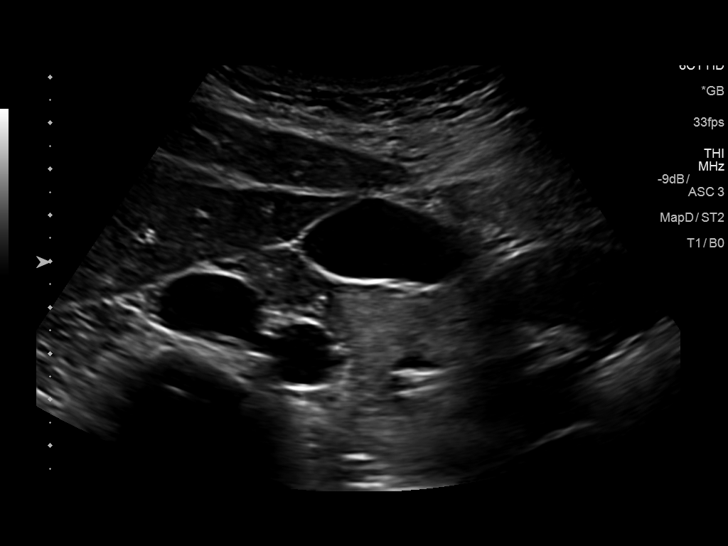
[im 11/41]
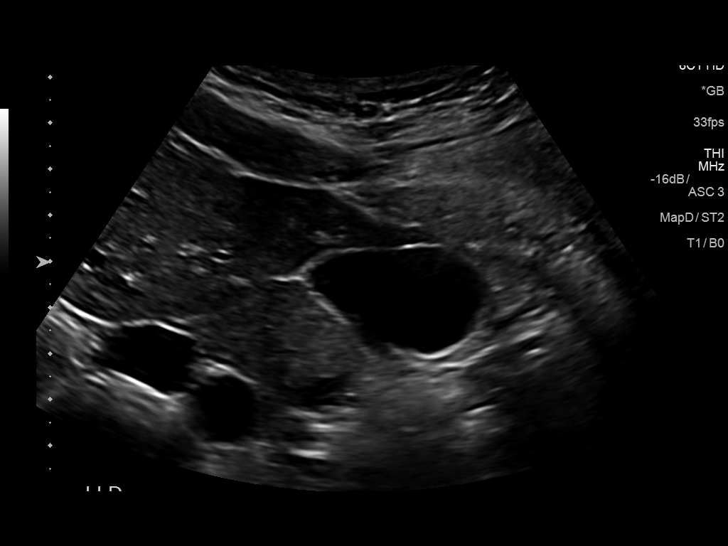
[im 14/41]
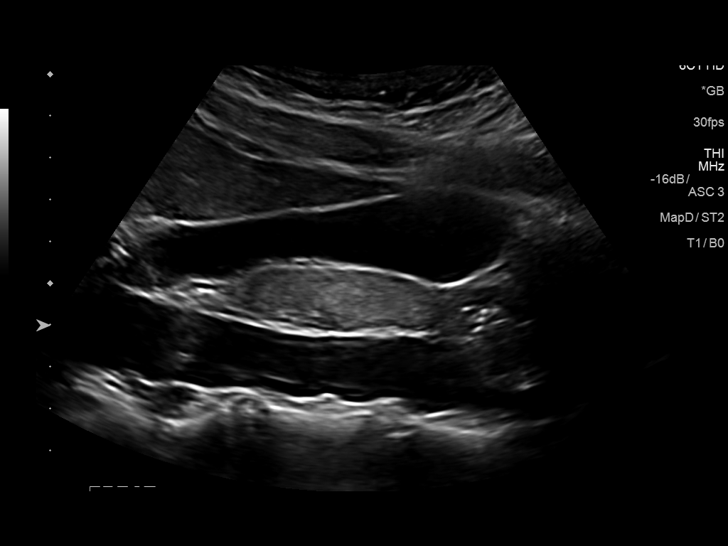
[im 16/41]
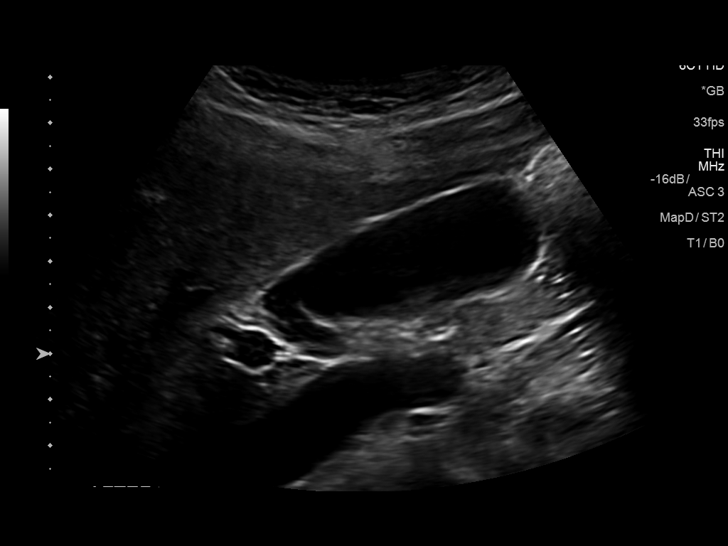
[im 19/41]
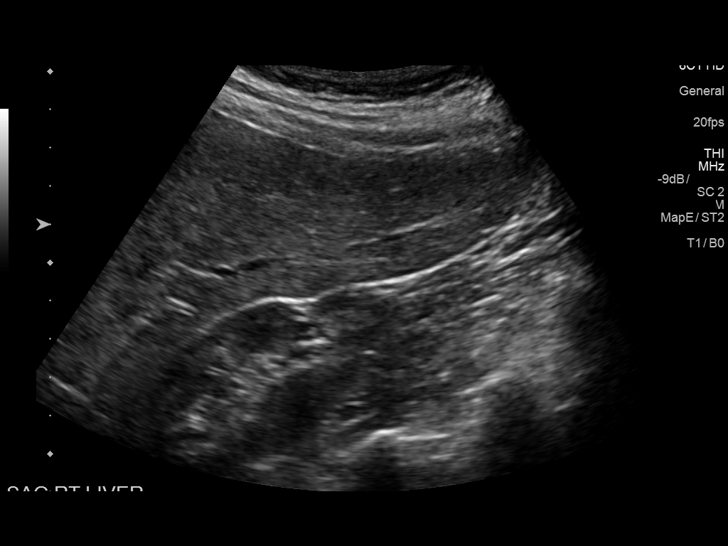
[im 22/41]
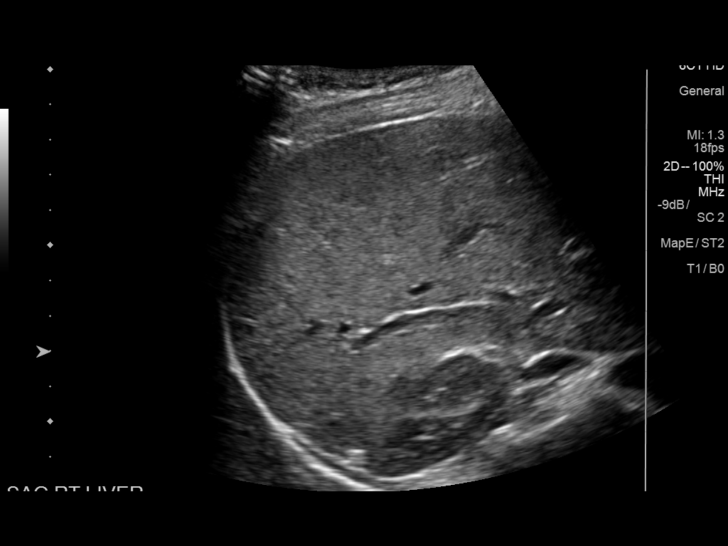
[im 26/41]
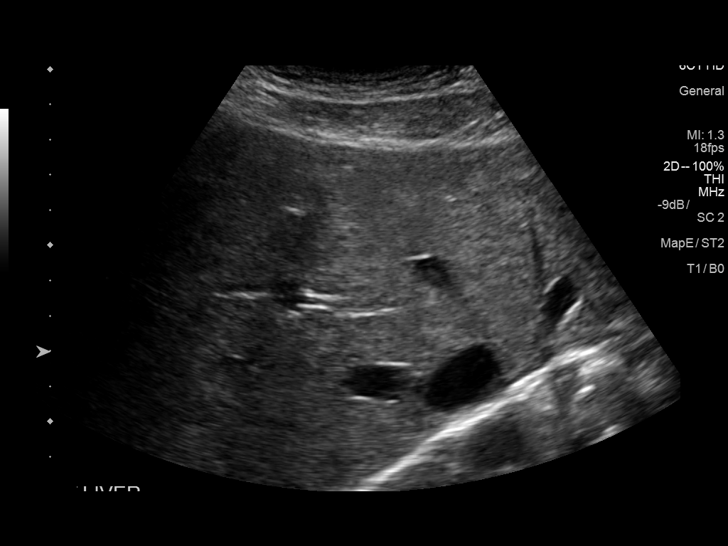
[im 27/41]
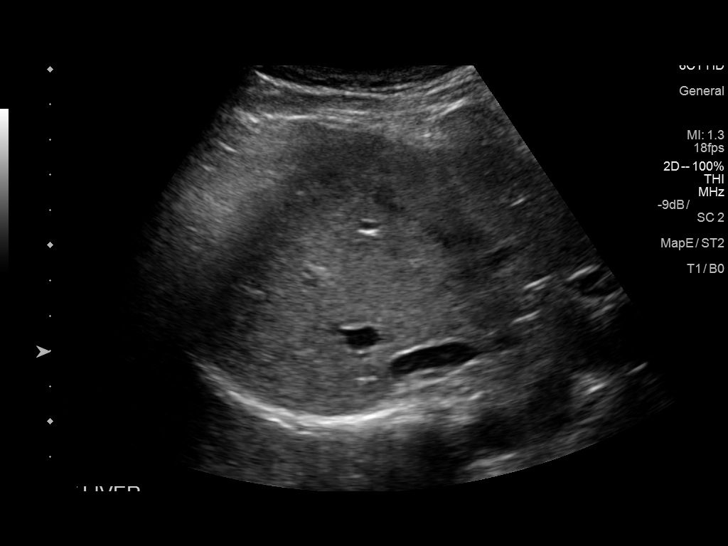
[im 31/41]
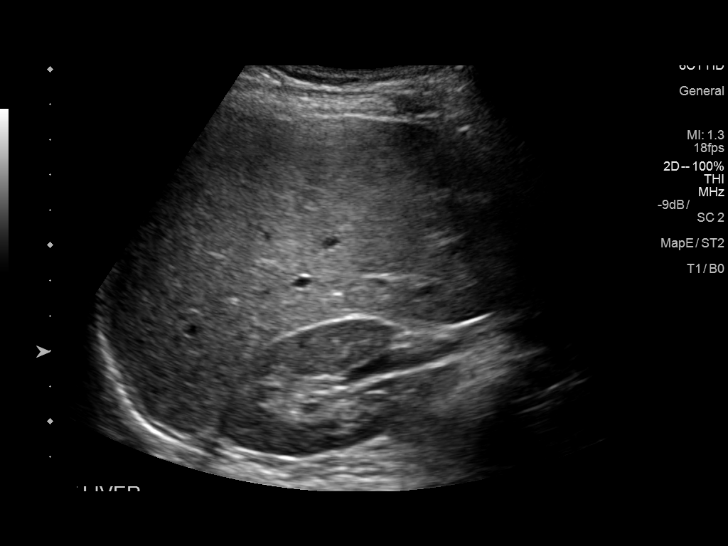
[im 34/41]
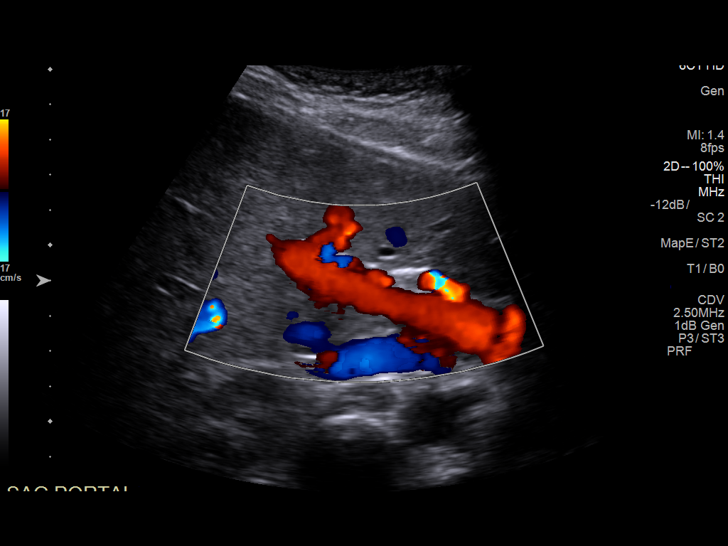
[im 37/41]
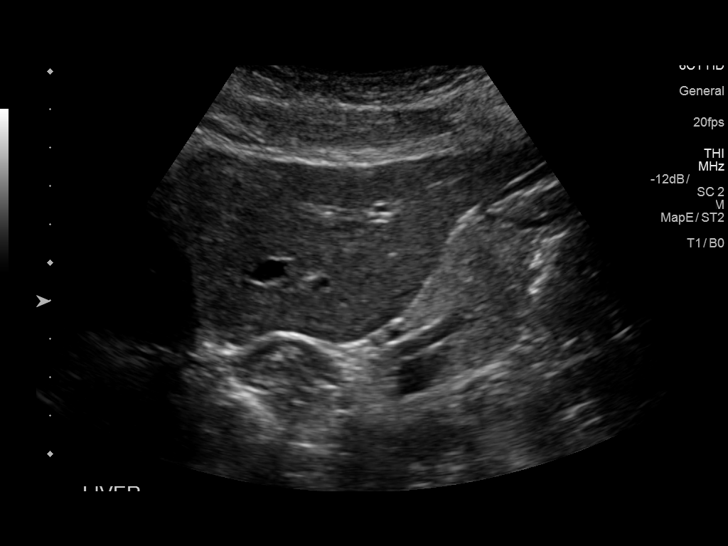
[im 41/41]
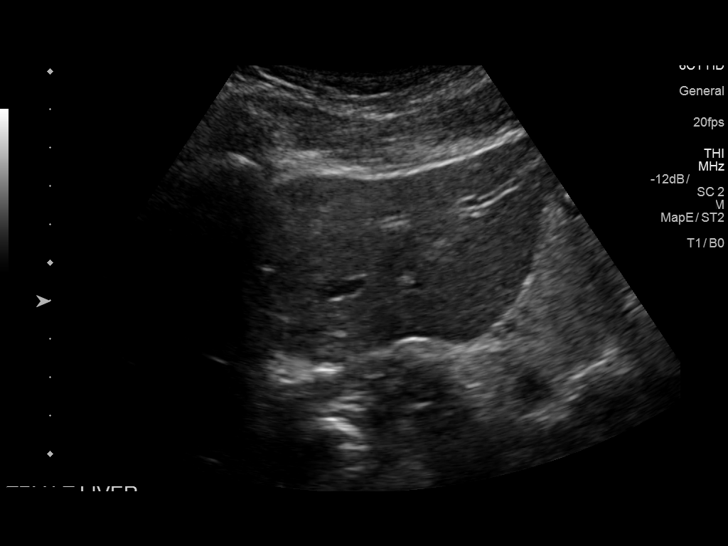

[14 of 25 positions shown; findings below may reference images not displayed]

FINDINGS: Gallbladder:

The gallbladder is visualized and no gallstones are noted. There is
no pain over the gallbladder with compression.

Common bile duct:

Diameter: Common bile duct is normal measuring 2.4 mm in diameter.

Liver:

The liver has a normal echogenic pattern. No focal hepatic
abnormality is seen. Portal vein is patent on color Doppler imaging
with normal direction of blood flow towards the liver.
IMPRESSION: Negative limited ultrasound of the right upper quadrant.

## 2020-09-13 DIAGNOSIS — M542 Cervicalgia: Secondary | ICD-10-CM | POA: Diagnosis not present

## 2020-10-03 DIAGNOSIS — R140 Abdominal distension (gaseous): Secondary | ICD-10-CM | POA: Diagnosis not present

## 2020-10-03 DIAGNOSIS — E039 Hypothyroidism, unspecified: Secondary | ICD-10-CM | POA: Diagnosis not present

## 2020-10-03 DIAGNOSIS — L700 Acne vulgaris: Secondary | ICD-10-CM | POA: Diagnosis not present

## 2020-10-03 DIAGNOSIS — F419 Anxiety disorder, unspecified: Secondary | ICD-10-CM | POA: Diagnosis not present

## 2020-10-04 DIAGNOSIS — E039 Hypothyroidism, unspecified: Secondary | ICD-10-CM | POA: Diagnosis not present

## 2020-10-04 DIAGNOSIS — R5383 Other fatigue: Secondary | ICD-10-CM | POA: Diagnosis not present

## 2020-10-17 DIAGNOSIS — M542 Cervicalgia: Secondary | ICD-10-CM | POA: Diagnosis not present

## 2020-11-08 DIAGNOSIS — M542 Cervicalgia: Secondary | ICD-10-CM | POA: Diagnosis not present

## 2020-11-29 DIAGNOSIS — M542 Cervicalgia: Secondary | ICD-10-CM | POA: Diagnosis not present

## 2020-12-01 DIAGNOSIS — R059 Cough, unspecified: Secondary | ICD-10-CM | POA: Diagnosis not present

## 2020-12-01 DIAGNOSIS — J069 Acute upper respiratory infection, unspecified: Secondary | ICD-10-CM | POA: Diagnosis not present

## 2020-12-01 DIAGNOSIS — Z03818 Encounter for observation for suspected exposure to other biological agents ruled out: Secondary | ICD-10-CM | POA: Diagnosis not present

## 2020-12-01 DIAGNOSIS — R509 Fever, unspecified: Secondary | ICD-10-CM | POA: Diagnosis not present

## 2020-12-20 DIAGNOSIS — M542 Cervicalgia: Secondary | ICD-10-CM | POA: Diagnosis not present

## 2021-01-10 DIAGNOSIS — M542 Cervicalgia: Secondary | ICD-10-CM | POA: Diagnosis not present

## 2021-01-18 DIAGNOSIS — R140 Abdominal distension (gaseous): Secondary | ICD-10-CM | POA: Diagnosis not present

## 2021-01-18 DIAGNOSIS — R102 Pelvic and perineal pain: Secondary | ICD-10-CM | POA: Diagnosis not present

## 2021-02-07 DIAGNOSIS — M542 Cervicalgia: Secondary | ICD-10-CM | POA: Diagnosis not present

## 2021-02-13 DIAGNOSIS — J029 Acute pharyngitis, unspecified: Secondary | ICD-10-CM | POA: Diagnosis not present

## 2021-02-13 DIAGNOSIS — B349 Viral infection, unspecified: Secondary | ICD-10-CM | POA: Diagnosis not present

## 2021-02-13 DIAGNOSIS — Z03818 Encounter for observation for suspected exposure to other biological agents ruled out: Secondary | ICD-10-CM | POA: Diagnosis not present

## 2021-02-13 DIAGNOSIS — R5383 Other fatigue: Secondary | ICD-10-CM | POA: Diagnosis not present

## 2021-02-16 ENCOUNTER — Encounter (HOSPITAL_COMMUNITY): Payer: Self-pay

## 2021-02-16 ENCOUNTER — Ambulatory Visit (HOSPITAL_COMMUNITY)
Admission: EM | Admit: 2021-02-16 | Discharge: 2021-02-16 | Disposition: A | Discharge status: Home / Self Care | Payer: BC Managed Care – PPO | Attending: Family Medicine | Admitting: Family Medicine

## 2021-02-16 ENCOUNTER — Other Ambulatory Visit: Payer: Self-pay

## 2021-02-16 DIAGNOSIS — Z79899 Other long term (current) drug therapy: Secondary | ICD-10-CM | POA: Insufficient documentation

## 2021-02-16 DIAGNOSIS — Z793 Long term (current) use of hormonal contraceptives: Secondary | ICD-10-CM | POA: Insufficient documentation

## 2021-02-16 DIAGNOSIS — Z79890 Hormone replacement therapy (postmenopausal): Secondary | ICD-10-CM | POA: Insufficient documentation

## 2021-02-16 DIAGNOSIS — J069 Acute upper respiratory infection, unspecified: Secondary | ICD-10-CM | POA: Diagnosis not present

## 2021-02-16 DIAGNOSIS — Z87891 Personal history of nicotine dependence: Secondary | ICD-10-CM | POA: Diagnosis not present

## 2021-02-16 DIAGNOSIS — Z7952 Long term (current) use of systemic steroids: Secondary | ICD-10-CM | POA: Insufficient documentation

## 2021-02-16 DIAGNOSIS — Z20822 Contact with and (suspected) exposure to covid-19: Secondary | ICD-10-CM | POA: Insufficient documentation

## 2021-02-16 DIAGNOSIS — J029 Acute pharyngitis, unspecified: Secondary | ICD-10-CM | POA: Diagnosis not present

## 2021-02-16 DIAGNOSIS — Z880 Allergy status to penicillin: Secondary | ICD-10-CM | POA: Diagnosis not present

## 2021-02-16 LAB — SARS CORONAVIRUS 2 (TAT 6-24 HRS): SARS Coronavirus 2: NEGATIVE

## 2021-02-16 LAB — POCT RAPID STREP A, ED / UC: Streptococcus, Group A Screen (Direct): NEGATIVE

## 2021-02-16 MED ORDER — LIDOCAINE VISCOUS HCL 2 % MT SOLN: 10.0000 mL | OROMUCOSAL | 0 refills | Status: AC | PRN

## 2021-02-16 MED ORDER — PREDNISONE 20 MG PO TABS: 40.0000 mg | ORAL_TABLET | Freq: Every day | ORAL | 0 refills | Status: AC

## 2021-02-16 NOTE — ED Provider Notes (Signed)
[facility]    CSN: [id number] Arrival date & time: 02/16/21  1109      History   Chief Complaint Chief Complaint  Patient presents with   Appointment    HPI Stefanie Holder is a 35 y.o. female.   Patient presenting today with 6-day history of ongoing sore throat, fatigue, congestion, cough, low-grade fevers, body aches.  States she was seen by her PCP earlier this week, tested negative for strep and COVID.  Has been taking DayQuil, NyQuil and over-the-counter pain relievers with mild temporary relief.  Throat is still red and very sore.  Denies chest pain, shortness of breath, abdominal pain, nausea vomiting or diarrhea.  No known sick contacts recently.   Past Medical History:  Diagnosis Date   Anxiety    Cervical strain    Depression    Muscle spasms of neck    Neuromuscular disorder (HCC)    MTHFR gene controlled on methyl folate supp and vit B6 and 12   Seizures (HCC)    pt reports "seizures from pain"    Patient Active Problem List   Diagnosis Date Noted   Paresthesia of right leg 03/19/2017   Paresthesia 01/30/2017   Left ear pain 01/30/2017   Generalized anxiety disorder 08/21/2012   Depression with anxiety 08/21/2012   Insomnia 08/21/2012    Past Surgical History:  Procedure Laterality Date   WISDOM TOOTH EXTRACTION      OB History   No obstetric history on file.      Home Medications    Prior to Admission medications   Medication Sig Start Date End Date Taking? Authorizing Provider  lidocaine (XYLOCAINE) 2 % solution Use as directed 10 mLs in the mouth or throat as needed for mouth pain. 02/16/21  Yes Particia Nearing, PA-C  predniSONE (DELTASONE) 20 MG tablet Take 2 tablets (40 mg total) by mouth daily with breakfast. 02/16/21  Yes Particia Nearing, PA-C  b complex vitamins tablet Take 1 tablet by mouth daily.    [provider]  clonazePAM (KLONOPIN) 1 MG tablet Take 1 tablet (1 mg total) by mouth at bedtime as needed  for anxiety. Fill after 60 days 01/26/16   Trena Platt D, PA  DULoxetine (CYMBALTA) 20 MG capsule Take 1 capsule (20 mg total) by mouth daily. 01/30/17   Levert Feinstein, MD  FLUoxetine (PROZAC) 20 MG capsule Take 3 capsules (60 mg total) by mouth daily. 07/11/15   Tonye Pearson, MD  Loratadine (CLARITIN PO) Take by mouth daily.    [provider]  Multiple Vitamins-Minerals (MULTIVITAMIN WITH MINERALS) tablet Take 1 tablet by mouth daily.    [provider]  Multiple Vitamins-Minerals (ZINC PO) Take by mouth daily.    [provider]  Norgestimate-Ethinyl Estradiol Triphasic (ORTHO TRI-CYCLEN, 28,) 0.18/0.215/0.25 MG-35 MCG tablet Take 1 tablet by mouth daily.    [provider]  Omega-3 Fatty Acids (FISH OIL PO) Take by mouth daily.    [provider]  Thyroid (LEVOTHYROXINE-LIOTHYRONINE) 60 MG TABS Take 1 tablet by mouth daily.    [provider]  triprolidine-pseudoephedrine (APRODINE) 2.5-60 MG TABS tablet Take 1 tablet by mouth every 6 (six) hours as needed for allergies.    [provider]    Family History Family History  Problem Relation Age of Onset   Depression Mother    Hypertension Father    Heart disease Maternal Grandmother     Social History Social History   Tobacco Use  Smoking status: Former    Packs/day: 0.20    Years: 12.00    Pack years: 2.40    Types: Cigarettes   Smokeless tobacco: Never   Tobacco comments:    01/30/17 - quit three month ago  Vaping Use   Vaping Use: Never used  Substance Use Topics   Alcohol use: Yes    Comment: glass of wine nightly   Drug use: No     Allergies   Amoxapine and related and Penicillins   Review of Systems Review of Systems Per HPI  Physical Exam Triage Vital Signs ED Triage Vitals  Enc Vitals Group     BP 02/16/21 1142 113/67     Pulse Rate 02/16/21 1142 60     Resp 02/16/21 1142 17     Temp 02/16/21 1142 98.8 F (37.1 C)     Temp  Source 02/16/21 1142 Oral     SpO2 02/16/21 1142 96 %     Weight --      Height --      Head Circumference --      Peak Flow --      Pain Score 02/16/21 1141 4     Pain Loc --      Pain Edu? --      Excl. in GC? --    No data found.  Updated Vital Signs BP 113/67 (BP Location: Left Arm)   Pulse 60   Temp 98.8 F (37.1 C) (Oral)   Resp 17   LMP 01/23/2021 (Exact Date)   SpO2 96%   Visual Acuity Right Eye Distance:   Left Eye Distance:   Bilateral Distance:    Right Eye Near:   Left Eye Near:    Bilateral Near:     Physical Exam Vitals and nursing note reviewed.  Constitutional:      Appearance: Normal appearance. She is not ill-appearing.  HENT:     Head: Atraumatic.     Right Ear: Tympanic membrane normal.     Left Ear: Tympanic membrane normal.     Nose: Nose normal.     Mouth/Throat:     Mouth: Mucous membranes are moist.     Pharynx: Oropharynx is clear. Posterior oropharyngeal erythema present. No oropharyngeal exudate.  Eyes:     Extraocular Movements: Extraocular movements intact.     Conjunctiva/sclera: Conjunctivae normal.  Cardiovascular:     Rate and Rhythm: Normal rate and regular rhythm.     Heart sounds: Normal heart sounds.  Pulmonary:     Effort: Pulmonary effort is normal.     Breath sounds: Normal breath sounds. No wheezing or rales.  Abdominal:     General: Bowel sounds are normal. There is no distension.     Palpations: Abdomen is soft.     Tenderness: There is no abdominal tenderness. There is no right CVA tenderness, left CVA tenderness or guarding.  Musculoskeletal:        General: Normal range of motion.     Cervical back: Normal range of motion and neck supple.  Skin:    General: Skin is warm and dry.  Neurological:     Mental Status: She is alert and oriented to person, place, and time.  Psychiatric:        Mood and Affect: Mood normal.        Thought Content: Thought content normal.        Judgment: Judgment normal.      UC Treatments / Results  Labs (all  labs ordered are listed, but only abnormal results are displayed) Labs Reviewed  SARS CORONAVIRUS 2 (TAT 6-24 HRS)  CULTURE, GROUP A STREP [facility])  POCT RAPID STREP A, ED / UC    EKG   Radiology No results found.  Procedures Procedures (including critical care time)  Medications Ordered in UC Medications - No data to display  Initial Impression / Assessment and Plan / UC Course  I have reviewed the triage vital signs and the nursing notes.  Pertinent labs & imaging results that were available during my care of the patient were reviewed by me and considered in my medical decision making (see chart for details).     Exam and vital signs reassuring, rapid strep again negative, throat culture and COVID PCR pending.  Will treat with short prednisone burst, viscous lidocaine given persistence of symptoms.  Discussed continued over-the-counter medications and supportive home care.  Return for worsening symptoms.  Final Clinical Impressions(s) / UC Diagnoses   Final diagnoses:  Viral URI  Sore throat   Discharge Instructions   None    ED Prescriptions     Medication Sig Dispense Auth. Provider   predniSONE (DELTASONE) 20 MG tablet Take 2 tablets (40 mg total) by mouth daily with breakfast. 10 tablet Particia Nearing, PA-C   lidocaine (XYLOCAINE) 2 % solution Use as directed 10 mLs in the mouth or throat as needed for mouth pain. 100 mL Particia Nearing, [place]      PDMP not reviewed this encounter.   Particia Nearing, [place] 02/16/21 1246

## 2021-02-16 NOTE — ED Triage Notes (Signed)
Pt presents with sore throat fever and nasal congestion x 6 days.   States she noticed the back of her throat is red. States she was tested for COVID and Strep. States it was negative.

## 2021-02-17 ENCOUNTER — Ambulatory Visit (HOSPITAL_COMMUNITY): Payer: Self-pay

## 2021-02-18 LAB — CULTURE, GROUP A STREP (THRC)

## 2021-02-28 DIAGNOSIS — M542 Cervicalgia: Secondary | ICD-10-CM | POA: Diagnosis not present

## 2021-03-14 DIAGNOSIS — K5900 Constipation, unspecified: Secondary | ICD-10-CM | POA: Diagnosis not present

## 2021-03-14 DIAGNOSIS — R195 Other fecal abnormalities: Secondary | ICD-10-CM | POA: Diagnosis not present

## 2021-03-14 DIAGNOSIS — R140 Abdominal distension (gaseous): Secondary | ICD-10-CM | POA: Diagnosis not present

## 2021-03-15 DIAGNOSIS — R195 Other fecal abnormalities: Secondary | ICD-10-CM | POA: Diagnosis not present

## 2021-04-17 DIAGNOSIS — F3340 Major depressive disorder, recurrent, in remission, unspecified: Secondary | ICD-10-CM | POA: Diagnosis not present

## 2021-04-17 DIAGNOSIS — E039 Hypothyroidism, unspecified: Secondary | ICD-10-CM | POA: Diagnosis not present

## 2021-04-17 DIAGNOSIS — K588 Other irritable bowel syndrome: Secondary | ICD-10-CM | POA: Diagnosis not present

## 2021-04-17 DIAGNOSIS — F419 Anxiety disorder, unspecified: Secondary | ICD-10-CM | POA: Diagnosis not present

## 2021-06-05 DIAGNOSIS — K5900 Constipation, unspecified: Secondary | ICD-10-CM | POA: Diagnosis not present

## 2021-06-05 DIAGNOSIS — R140 Abdominal distension (gaseous): Secondary | ICD-10-CM | POA: Diagnosis not present

## 2021-06-05 DIAGNOSIS — K219 Gastro-esophageal reflux disease without esophagitis: Secondary | ICD-10-CM | POA: Diagnosis not present

## 2021-06-05 DIAGNOSIS — M542 Cervicalgia: Secondary | ICD-10-CM | POA: Diagnosis not present

## 2021-06-05 DIAGNOSIS — K588 Other irritable bowel syndrome: Secondary | ICD-10-CM | POA: Diagnosis not present

## 2021-07-17 DIAGNOSIS — R12 Heartburn: Secondary | ICD-10-CM | POA: Diagnosis not present

## 2021-07-17 DIAGNOSIS — K639 Disease of intestine, unspecified: Secondary | ICD-10-CM | POA: Diagnosis not present

## 2021-07-17 DIAGNOSIS — B9689 Other specified bacterial agents as the cause of diseases classified elsewhere: Secondary | ICD-10-CM | POA: Diagnosis not present

## 2021-07-17 DIAGNOSIS — R140 Abdominal distension (gaseous): Secondary | ICD-10-CM | POA: Diagnosis not present

## 2021-07-17 DIAGNOSIS — R1084 Generalized abdominal pain: Secondary | ICD-10-CM | POA: Diagnosis not present

## 2021-07-17 DIAGNOSIS — R142 Eructation: Secondary | ICD-10-CM | POA: Diagnosis not present

## 2021-07-25 DIAGNOSIS — M542 Cervicalgia: Secondary | ICD-10-CM | POA: Diagnosis not present

## 2021-07-31 DIAGNOSIS — Z01419 Encounter for gynecological examination (general) (routine) without abnormal findings: Secondary | ICD-10-CM | POA: Diagnosis not present

## 2021-07-31 DIAGNOSIS — Z130 Encounter for screening for diseases of the blood and blood-forming organs and certain disorders involving the immune mechanism: Secondary | ICD-10-CM | POA: Diagnosis not present

## 2021-07-31 DIAGNOSIS — Z1389 Encounter for screening for other disorder: Secondary | ICD-10-CM | POA: Diagnosis not present

## 2021-07-31 DIAGNOSIS — Z6824 Body mass index (BMI) 24.0-24.9, adult: Secondary | ICD-10-CM | POA: Diagnosis not present

## 2021-08-08 ENCOUNTER — Other Ambulatory Visit: Payer: Self-pay | Admitting: Physician Assistant

## 2021-08-08 DIAGNOSIS — R140 Abdominal distension (gaseous): Secondary | ICD-10-CM | POA: Diagnosis not present

## 2021-08-08 DIAGNOSIS — K5900 Constipation, unspecified: Secondary | ICD-10-CM | POA: Diagnosis not present

## 2021-08-08 DIAGNOSIS — R109 Unspecified abdominal pain: Secondary | ICD-10-CM | POA: Diagnosis not present

## 2021-08-15 DIAGNOSIS — M542 Cervicalgia: Secondary | ICD-10-CM | POA: Diagnosis not present

## 2021-08-24 ENCOUNTER — Ambulatory Visit
Admission: RE | Admit: 2021-08-24 | Discharge: 2021-08-24 | Disposition: A | Discharge status: Home / Self Care | Payer: BC Managed Care – PPO | Source: Ambulatory Visit | Attending: Physician Assistant | Admitting: Physician Assistant

## 2021-08-24 DIAGNOSIS — Q433 Congenital malformations of intestinal fixation: Secondary | ICD-10-CM | POA: Diagnosis not present

## 2021-08-24 DIAGNOSIS — R140 Abdominal distension (gaseous): Secondary | ICD-10-CM | POA: Diagnosis not present

## 2021-08-24 DIAGNOSIS — N8311 Corpus luteum cyst of right ovary: Secondary | ICD-10-CM | POA: Diagnosis not present

## 2021-08-24 DIAGNOSIS — R109 Unspecified abdominal pain: Secondary | ICD-10-CM

## 2021-08-24 MED ORDER — IOPAMIDOL (ISOVUE-300) INJECTION 61%
100.0000 mL | Freq: Once | INTRAVENOUS | Status: AC | PRN
  Administered 2021-08-24: 100 mL via INTRAVENOUS

## 2021-09-12 DIAGNOSIS — M542 Cervicalgia: Secondary | ICD-10-CM | POA: Diagnosis not present

## 2021-09-26 DIAGNOSIS — F419 Anxiety disorder, unspecified: Secondary | ICD-10-CM | POA: Diagnosis not present

## 2021-09-26 DIAGNOSIS — Z13828 Encounter for screening for other musculoskeletal disorder: Secondary | ICD-10-CM | POA: Diagnosis not present

## 2021-09-26 DIAGNOSIS — Z131 Encounter for screening for diabetes mellitus: Secondary | ICD-10-CM | POA: Diagnosis not present

## 2021-09-26 DIAGNOSIS — E538 Deficiency of other specified B group vitamins: Secondary | ICD-10-CM | POA: Diagnosis not present

## 2021-09-26 DIAGNOSIS — E039 Hypothyroidism, unspecified: Secondary | ICD-10-CM | POA: Diagnosis not present

## 2021-09-26 DIAGNOSIS — R5381 Other malaise: Secondary | ICD-10-CM | POA: Diagnosis not present

## 2021-09-26 DIAGNOSIS — R5383 Other fatigue: Secondary | ICD-10-CM | POA: Diagnosis not present

## 2021-09-26 DIAGNOSIS — K219 Gastro-esophageal reflux disease without esophagitis: Secondary | ICD-10-CM | POA: Diagnosis not present

## 2021-09-26 DIAGNOSIS — E559 Vitamin D deficiency, unspecified: Secondary | ICD-10-CM | POA: Diagnosis not present

## 2021-10-10 DIAGNOSIS — M542 Cervicalgia: Secondary | ICD-10-CM | POA: Diagnosis not present

## 2021-10-30 DIAGNOSIS — M542 Cervicalgia: Secondary | ICD-10-CM | POA: Diagnosis not present

## 2021-11-09 DIAGNOSIS — F429 Obsessive-compulsive disorder, unspecified: Secondary | ICD-10-CM | POA: Diagnosis not present

## 2021-11-09 DIAGNOSIS — K219 Gastro-esophageal reflux disease without esophagitis: Secondary | ICD-10-CM | POA: Diagnosis not present

## 2021-11-09 DIAGNOSIS — Z8619 Personal history of other infectious and parasitic diseases: Secondary | ICD-10-CM | POA: Diagnosis not present

## 2021-11-09 DIAGNOSIS — R140 Abdominal distension (gaseous): Secondary | ICD-10-CM | POA: Diagnosis not present

## 2021-11-09 DIAGNOSIS — Z1329 Encounter for screening for other suspected endocrine disorder: Secondary | ICD-10-CM | POA: Diagnosis not present

## 2021-11-09 DIAGNOSIS — E039 Hypothyroidism, unspecified: Secondary | ICD-10-CM | POA: Diagnosis not present

## 2021-11-13 DIAGNOSIS — F3340 Major depressive disorder, recurrent, in remission, unspecified: Secondary | ICD-10-CM | POA: Diagnosis not present

## 2021-11-13 DIAGNOSIS — F419 Anxiety disorder, unspecified: Secondary | ICD-10-CM | POA: Diagnosis not present

## 2021-11-13 DIAGNOSIS — E039 Hypothyroidism, unspecified: Secondary | ICD-10-CM | POA: Diagnosis not present

## 2021-11-13 DIAGNOSIS — K588 Other irritable bowel syndrome: Secondary | ICD-10-CM | POA: Diagnosis not present

## 2021-11-22 DIAGNOSIS — M542 Cervicalgia: Secondary | ICD-10-CM | POA: Diagnosis not present

## 2021-12-13 DIAGNOSIS — M542 Cervicalgia: Secondary | ICD-10-CM | POA: Diagnosis not present

## 2021-12-19 DIAGNOSIS — L818 Other specified disorders of pigmentation: Secondary | ICD-10-CM | POA: Diagnosis not present

## 2021-12-19 DIAGNOSIS — L820 Inflamed seborrheic keratosis: Secondary | ICD-10-CM | POA: Diagnosis not present

## 2021-12-19 DIAGNOSIS — L728 Other follicular cysts of the skin and subcutaneous tissue: Secondary | ICD-10-CM | POA: Diagnosis not present

## 2021-12-19 DIAGNOSIS — L821 Other seborrheic keratosis: Secondary | ICD-10-CM | POA: Diagnosis not present

## 2021-12-19 DIAGNOSIS — Z1283 Encounter for screening for malignant neoplasm of skin: Secondary | ICD-10-CM | POA: Diagnosis not present

## 2022-01-03 DIAGNOSIS — M542 Cervicalgia: Secondary | ICD-10-CM | POA: Diagnosis not present

## 2022-01-22 DIAGNOSIS — M542 Cervicalgia: Secondary | ICD-10-CM | POA: Diagnosis not present

## 2022-02-13 DIAGNOSIS — M542 Cervicalgia: Secondary | ICD-10-CM | POA: Diagnosis not present

## 2022-02-15 DIAGNOSIS — R6889 Other general symptoms and signs: Secondary | ICD-10-CM | POA: Diagnosis not present

## 2022-02-15 DIAGNOSIS — F429 Obsessive-compulsive disorder, unspecified: Secondary | ICD-10-CM | POA: Diagnosis not present

## 2022-02-15 DIAGNOSIS — K219 Gastro-esophageal reflux disease without esophagitis: Secondary | ICD-10-CM | POA: Diagnosis not present

## 2022-02-15 DIAGNOSIS — R140 Abdominal distension (gaseous): Secondary | ICD-10-CM | POA: Diagnosis not present

## 2022-02-15 DIAGNOSIS — A493 Mycoplasma infection, unspecified site: Secondary | ICD-10-CM | POA: Diagnosis not present

## 2022-02-15 DIAGNOSIS — R6882 Decreased libido: Secondary | ICD-10-CM | POA: Diagnosis not present

## 2022-02-15 DIAGNOSIS — E538 Deficiency of other specified B group vitamins: Secondary | ICD-10-CM | POA: Diagnosis not present

## 2022-02-15 DIAGNOSIS — E559 Vitamin D deficiency, unspecified: Secondary | ICD-10-CM | POA: Diagnosis not present

## 2022-02-15 DIAGNOSIS — E039 Hypothyroidism, unspecified: Secondary | ICD-10-CM | POA: Diagnosis not present

## 2022-02-27 DIAGNOSIS — M542 Cervicalgia: Secondary | ICD-10-CM | POA: Diagnosis not present

## 2022-03-25 DIAGNOSIS — L309 Dermatitis, unspecified: Secondary | ICD-10-CM | POA: Diagnosis not present

## 2023-11-25 DIAGNOSIS — Z13220 Encounter for screening for lipoid disorders: Secondary | ICD-10-CM | POA: Diagnosis not present

## 2023-11-25 DIAGNOSIS — R5383 Other fatigue: Secondary | ICD-10-CM | POA: Diagnosis not present

## 2023-11-25 DIAGNOSIS — F3340 Major depressive disorder, recurrent, in remission, unspecified: Secondary | ICD-10-CM | POA: Diagnosis not present

## 2023-11-25 DIAGNOSIS — E039 Hypothyroidism, unspecified: Secondary | ICD-10-CM | POA: Diagnosis not present

## 2023-11-25 DIAGNOSIS — F419 Anxiety disorder, unspecified: Secondary | ICD-10-CM | POA: Diagnosis not present

## 2023-11-25 DIAGNOSIS — Z0000 Encounter for general adult medical examination without abnormal findings: Secondary | ICD-10-CM | POA: Diagnosis not present
